# Patient Record
Sex: Female | Born: 1995
Health system: Southern US, Community
[De-identification: ages and names within clinical notes are randomized; demographics above are authoritative.]

## PROBLEM LIST (undated history)

## (undated) DIAGNOSIS — T7840XA Allergy, unspecified, initial encounter: Secondary | ICD-10-CM

## (undated) DIAGNOSIS — F419 Anxiety disorder, unspecified: Secondary | ICD-10-CM

## (undated) HISTORY — DX: Allergy, unspecified, initial encounter: T78.40XA

## (undated) HISTORY — DX: Anxiety disorder, unspecified: F41.9

---
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
## 2016-12-30 DIAGNOSIS — L700 Acne vulgaris: Secondary | ICD-10-CM | POA: Diagnosis not present

## 2017-02-15 ENCOUNTER — Ambulatory Visit (INDEPENDENT_AMBULATORY_CARE_PROVIDER_SITE_OTHER): Payer: BLUE CROSS/BLUE SHIELD | Admitting: Family Medicine

## 2017-02-15 ENCOUNTER — Encounter: Payer: Self-pay | Admitting: Family Medicine

## 2017-02-15 VITALS — BP 108/70 | HR 86 | Temp 98.0°F | Ht 61.5 in | Wt 126.5 lb

## 2017-02-15 DIAGNOSIS — L700 Acne vulgaris: Secondary | ICD-10-CM | POA: Diagnosis not present

## 2017-02-15 DIAGNOSIS — Z113 Encounter for screening for infections with a predominantly sexual mode of transmission: Secondary | ICD-10-CM | POA: Insufficient documentation

## 2017-02-15 DIAGNOSIS — Z0000 Encounter for general adult medical examination without abnormal findings: Secondary | ICD-10-CM | POA: Diagnosis not present

## 2017-02-15 DIAGNOSIS — Z23 Encounter for immunization: Secondary | ICD-10-CM | POA: Diagnosis not present

## 2017-02-15 MED ORDER — NORGESTIMATE-ETH ESTRADIOL 0.25-35 MG-MCG PO TABS: 1.0000 | ORAL_TABLET | Freq: Every day | ORAL | 11 refills | Status: DC

## 2017-02-15 NOTE — Assessment & Plan Note (Signed)
Reviewed health habits including diet and exercise and skin cancer prevention Reviewed appropriate screening tests for age  Also reviewed health mt list, fam hx and immunization status , as well as social and family history   See HPI STD screening today at pt req for gc/chlamydia Started OC  Meningococcal vaccine given  Disc HPV vaccine- enc her to return for that or get it at school Plan to start pap tests at age 21 Disc expectations for health and college

## 2017-02-15 NOTE — Assessment & Plan Note (Signed)
gc and chlamydia tests today No symptoms  Uses condoms /disc std prev She declines hiv/ rpr tests today

## 2017-02-15 NOTE — Assessment & Plan Note (Addendum)
Under tx of derm- on retin A and doxycycline Will start OC today  Per pt- may be planning to start accutane in the future   Long discussion re: way to take OC properly and avoidance of smoking  Risks of blood clots outlined as well as possible side eff Pt aware that this does not prevent stds and condoms should still be used inst that it may take up to 3 months for menses to fall into rhythm or side eff to stop  Adv to call if problems or questions

## 2017-02-15 NOTE — Patient Instructions (Addendum)
2nd meningococcal vaccine today   I want you to read about and consider the HPV vaccine   If you travel out of the country -we would consider the Hepatitis A vaccine   Start the oral contraceptive the first Sunday after your period starts  I sent in the generic for ortho cyclen  Stick with mornings or evenings (set alarm on your cell phone) It will take generally 3 months for menses to become predictable and for acne to improve  You must use condoms for STD prevention ! If any intolerable side effects or menstrual irregularity-let me  Don't smoke (very dangerous on the pill)   Follow up with your dermatologist as planned

## 2017-02-15 NOTE — Progress Notes (Signed)
Subjective:    Patient ID: Danielle Perry, female    DOB: 07/17/1996, 21 y.o.   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   bmi 23.5  Imms: Had meningococcal 2/15  Tdap 5/10   Needs 2nd meningococcal vaccine   Periods are regular  Not too heavy or painful -just the first day  Last 5 days  Has been sexually active  Using condoms for birth control   She is interested in starting OC   Has not had the HPV vaccine   Is interested in gc/chlamydia screening  No symptoms  Declines hiv or rpr or other tests    She takes doxycycline and retin A for acne   Patient Active Problem List   Diagnosis Date Noted  . Routine general medical examination at a health care facility 02/15/2017  . Screen for STD (sexually transmitted disease) 02/15/2017  . Dysmenorrhea 10/19/2013  . Acne 10/19/2013  . Well adolescent visit 10/13/2012   No past medical history on file. No past surgical history on file. Social History  Substance Use Topics  . Smoking status: Never Smoker  . Smokeless tobacco: Never Used  . Alcohol use No   No family history on file. Allergies  Allergen Reactions  . Penicillins    No current outpatient prescriptions on file prior to visit.   No current facility-administered medications on file prior to visit.       Review of Systems Review of Systems  Constitutional: Negative for fever, appetite change, fatigue and unexpected  weight change.  Eyes: Negative for pain and visual disturbance.  Respiratory: Negative for cough and shortness of breath.   Cardiovascular: Negative for cp or palpitations    Gastrointestinal: Negative for nausea, diarrhea and constipation.  Genitourinary: Negative for urgency and frequency. neg for vaginal d/c or pelvic pain Skin: Negative for pallor or rash  pos for facial acne/ some on back -difficult to control Neurological: Negative for weakness, light-headedness, numbness and headaches.  Hematological: Negative for adenopathy. Does not bruise/bleed easily.  Psychiatric/Behavioral: Negative for dysphoric mood. The patient is not nervous/anxious.         Objective:   Physical Exam  Constitutional: She appears well-developed and well-nourished. No distress.  Well appearing   HENT:  Head: Normocephalic and atraumatic.  Right Ear: External ear normal.  Left Ear: External ear normal.  Nose: Nose normal.  Mouth/Throat: Oropharynx is clear and moist.  Eyes: Conjunctivae and EOM are normal. Pupils are equal, round, and reactive to light. Right eye exhibits no discharge. Left eye exhibits no discharge. No scleral icterus.  Neck: Normal range of motion. Neck supple. No JVD present. Carotid bruit is not present. No thyromegaly present.  Cardiovascular: Normal rate, regular rhythm, normal heart sounds and intact distal pulses.  Exam reveals no gallop.   Pulmonary/Chest: Effort normal and breath  sounds normal. No respiratory distress. She has no wheezes. She has no rales.  Abdominal: Soft. Bowel sounds are normal. She exhibits no distension and no mass. There is no tenderness.  Musculoskeletal: She exhibits no edema or tenderness.  Lymphadenopathy:    She has no cervical adenopathy.  Neurological: She is alert. She has normal reflexes. No cranial nerve deficit. She exhibits normal muscle tone. Coordination normal.  Skin: Skin is warm and dry. No rash noted. No erythema. No pallor.    Significant inflammatory acne vulgaris on face and upper back   Psychiatric: She has a normal mood and affect.  Pleasant and talkative           Assessment & Plan:   Problem List Items Addressed This Visit      Musculoskeletal and Integument   Acne    Under tx of derm- on retin A and doxycycline Will start OC today  Per pt- may be planning to start accutane in the future   Long discussion re: way to take OC properly and avoidance of smoking  Risks of blood clots outlined as well as possible side eff Pt aware that this does not prevent stds and condoms should still be used inst that it may take up to 3 months for menses to fall into rhythm or side eff to stop  Adv to call if problems or questions         Relevant Medications   doxycycline (VIBRAMYCIN) 100 MG capsule   tretinoin (RETIN-A) 0.025 % cream   norgestimate-ethinyl estradiol (ORTHO-CYCLEN,SPRINTEC,PREVIFEM) 0.25-35 MG-MCG tablet     Other   Routine general medical examination at a health care facility - Primary    Reviewed health habits including diet and exercise and skin cancer prevention Reviewed appropriate screening tests for age  Also reviewed health mt list, fam hx and immunization status , as well as social and family history   See HPI STD screening today at pt req for gc/chlamydia Started OC  Meningococcal vaccine given  Disc HPV vaccine- enc her to return for that or get it at school Plan to start pap tests at age 29 Disc expectations for health and college        Screen for STD (sexually transmitted disease)    gc and chlamydia tests today No symptoms  Uses condoms /disc std prev She declines hiv/ rpr tests today      Relevant Orders   GC/Chlamydia Probe Amp    Other Visit Diagnoses    Need for meningitis vaccination       Relevant Orders   Meningococcal conjugate vaccine 4-valent IM (Completed)

## 2017-02-16 LAB — GC/CHLAMYDIA PROBE AMP
CT PROBE, AMP APTIMA: NOT DETECTED
GC Probe RNA: NOT DETECTED

## 2017-03-18 ENCOUNTER — Telehealth: Payer: Self-pay

## 2017-03-18 NOTE — Telephone Encounter (Signed)
Immunization fixed and pt notified copy is ready for pick up

## 2017-03-18 NOTE — Telephone Encounter (Signed)
I don't give Td to young folks as a rule so I'm sure it was a Tdap (unless chart states she is allergic to Tdap-which it dose not)  Can go ahead and document Tdap for last tetanus shot  Thanks

## 2017-03-18 NOTE — Telephone Encounter (Signed)
I looked in [facility] and she has no immunizations, I'm not sure if pt received Td or Tdap I can't tell either and it looks like this vaccine xfered from the old computer system, I checked with Lyla SonCarrie and no one has access to the old computer sys to check, please advise

## 2017-03-18 NOTE — Telephone Encounter (Signed)
Pt needs documentation of a Tdap; when print the immunization list it comes up Td but if you click on Td it has state Tdap. How to get the Tdap to print? Pt request cb.

## 2017-03-31 DIAGNOSIS — L700 Acne vulgaris: Secondary | ICD-10-CM | POA: Diagnosis not present

## 2017-04-11 ENCOUNTER — Encounter: Payer: Self-pay | Admitting: Family Medicine

## 2017-08-19 DIAGNOSIS — L700 Acne vulgaris: Secondary | ICD-10-CM | POA: Diagnosis not present

## 2017-10-21 ENCOUNTER — Ambulatory Visit: Payer: BLUE CROSS/BLUE SHIELD | Admitting: Family Medicine

## 2017-10-21 ENCOUNTER — Ambulatory Visit (INDEPENDENT_AMBULATORY_CARE_PROVIDER_SITE_OTHER): Payer: BLUE CROSS/BLUE SHIELD | Admitting: Family Medicine

## 2017-10-21 ENCOUNTER — Ambulatory Visit (INDEPENDENT_AMBULATORY_CARE_PROVIDER_SITE_OTHER)
Admission: RE | Admit: 2017-10-21 | Discharge: 2017-10-21 | Disposition: A | Discharge status: Home / Self Care | Payer: BLUE CROSS/BLUE SHIELD | Source: Ambulatory Visit | Attending: Family Medicine | Admitting: Family Medicine

## 2017-10-21 ENCOUNTER — Encounter: Payer: Self-pay | Admitting: Family Medicine

## 2017-10-21 VITALS — BP 106/70 | HR 95 | Temp 98.5°F | Ht 61.5 in | Wt 131.1 lb

## 2017-10-21 DIAGNOSIS — M25561 Pain in right knee: Secondary | ICD-10-CM

## 2017-10-21 MED ORDER — MELOXICAM 15 MG PO TABS: 15.0000 mg | ORAL_TABLET | Freq: Every day | ORAL | 1 refills | Status: DC

## 2017-10-21 NOTE — Patient Instructions (Signed)
I think you have knee pain - that may be related to strain/patellar tracking problem  Let's check an xray today  Start meloxicam 15 mg once daily with food for 2-4 weeks  Ice - at least twice daily for 10 minutes  Avoid exercise that really hurts (ie: squats)   Get a neoprene knee sleeve with patellar hole -at the drug store   We will make a plan from there

## 2017-10-21 NOTE — Progress Notes (Signed)
Subjective:    Patient ID: Danielle Perry, female    DOB: 11/05/1995, 22 y.o.   MRN: [id number]  HPI Here today for R knee pain   Started around mid December No injury or trauma  ? If she could have slipped on the ice - but did not fall  Not swollen but feels tight  Hurts to bend it and to straighten all the way  Hurts on both sides of the patella - occ the whole knee More sharp than dull pain  Wakes up with pain sometimes and it is more severe (esp if she sleeps on her L side)   Hurts more to walk up a hill Stairs bother her - both up and down  Hurts after inactivity (gets stiff)   Pops at times  Never had knee problem before   Exercise - does planks, lunges/ and treadmill  No sports currently   Has taken ibuprofen and aleve-not helpful  No ice or heat  No knee brace  Wt Readings from Last 3 Encounters:  10/21/17 131 lb 1.9 oz (59.5 kg)  02/15/17 126 lb 8 oz (57.4 kg)  10/19/13 140 lb 12 oz (63.8 kg) (78 %, Z= 0.78)*   * Growth percentiles are based on CDC (Girls, 2-20 Years) data.   24.37 kg/m   Patient Active Problem List   Diagnosis Date Noted  . Right anterior knee pain 10/21/2017  . Routine general medical examination at a health care facility 02/15/2017  . Screen for STD (sexually transmitted disease) 02/15/2017  . Dysmenorrhea 10/19/2013  . Acne 10/19/2013  . Well adolescent visit 10/13/2012   History reviewed. No pertinent past medical history. History reviewed. No pertinent surgical history. Social History   Tobacco Use  . Smoking status: Never Smoker  . Smokeless tobacco: Never Used  Substance Use Topics  . Alcohol use: No  . Drug use: No   History reviewed. No pertinent family history. Allergies  Allergen Reactions  . Penicillins    Current Outpatient Medications on File Prior to Visit  Medication Sig Dispense Refill  . norgestimate-ethinyl estradiol (ORTHO-CYCLEN,SPRINTEC,PREVIFEM) 0.25-35 MG-MCG tablet Take 1 tablet by mouth daily. 1  Package 11  . tretinoin (RETIN-A) 0.025 % cream Apply 1 application topically at bedtime.  5   No current facility-administered medications on file prior to visit.      Review of Systems  Constitutional: Negative for activity change, appetite change, fatigue, fever and unexpected weight change.  HENT: Negative for congestion, ear pain, rhinorrhea, sinus pressure and sore throat.   Eyes: Negative for pain, redness and visual disturbance.  Respiratory: Negative for cough, shortness of breath and wheezing.   Cardiovascular: Negative for chest pain and palpitations.  Gastrointestinal: Negative for abdominal pain, blood in stool, constipation and diarrhea.  Endocrine: Negative for polydipsia and polyuria.  Genitourinary: Negative for dysuria, frequency and urgency.  Musculoskeletal: Positive for arthralgias. Negative for back pain and myalgias.       Right knee pain   Skin: Negative for pallor and rash.  Allergic/Immunologic: Negative for environmental allergies.  Neurological: Negative for dizziness, syncope and headaches.  Hematological: Negative for adenopathy. Does not bruise/bleed easily.  Psychiatric/Behavioral: Negative for decreased concentration and dysphoric mood. The patient is not nervous/anxious.        Objective:   Physical Exam  Constitutional: She appears well-developed and well-nourished. No distress.  Well appearing   HENT:  Head: Normocephalic and atraumatic.  Eyes: Conjunctivae and EOM are normal. Pupils are equal, round, and  reactive to light. Right eye exhibits no discharge. Left eye exhibits no discharge. No scleral icterus.  Neck: Normal range of motion. Neck supple.  Cardiovascular: Normal rate, regular rhythm and normal heart sounds.  Pulmonary/Chest: Effort normal and breath sounds normal.  Musculoskeletal: She exhibits tenderness. She exhibits no edema.       Right knee: She exhibits normal range of motion, no swelling, no effusion, no ecchymosis, no  deformity, no erythema, normal alignment, no LCL laxity, normal patellar mobility, no bony tenderness and normal meniscus. Tenderness found. Medial joint line, lateral joint line and patellar tendon tenderness noted.  Pain on full flexion of R knee  Apprehensive to fully extend  Pos bounce test  Neg mc murray Tender -bilat joint lines and patellar tendon  Pos patellar grind   No eff or edema   Lymphadenopathy:    She has no cervical adenopathy.  Neurological: She is alert. She has normal strength and normal reflexes. She displays no atrophy. No sensory deficit. She exhibits normal muscle tone.  Skin: Skin is warm and dry. No rash noted. No erythema. No pallor.  Psychiatric: She has a normal mood and affect.          Assessment & Plan:   Problem List Items Addressed This Visit      Other   Right anterior knee pain - Primary    Suspect patellofemoral etiology/strain  Handout given  Disc use of ice/ neoprene knee sleeve/elevation  Stretching as tolerated meloxicam 15 mg with food daily prn  Xray today        Relevant Orders   DG Knee 4 Views W/Patella Right (Completed)

## 2017-10-22 NOTE — Assessment & Plan Note (Signed)
Suspect patellofemoral etiology/strain  Handout given  Disc use of ice/ neoprene knee sleeve/elevation  Stretching as tolerated meloxicam 15 mg with food daily prn  Xray today

## 2017-10-24 ENCOUNTER — Telehealth: Payer: Self-pay | Admitting: Family Medicine

## 2017-10-24 DIAGNOSIS — M25561 Pain in right knee: Secondary | ICD-10-CM

## 2017-10-24 NOTE — Telephone Encounter (Signed)
-----   Message from Shon MilletShapale M [place], [place] sent at 10/24/2017 12:39 PM EST ----- Pt notified of xray results and Dr. Royden Purlower's comments. Pt agrees with referral if possible [place], please put referral in and I [facility]

## 2017-10-24 NOTE — Telephone Encounter (Signed)
Thanks  [facility]

## 2017-10-28 DIAGNOSIS — S86811A Strain of other muscle(s) and tendon(s) at lower leg level, right leg, initial encounter: Secondary | ICD-10-CM | POA: Diagnosis not present

## 2018-01-13 DIAGNOSIS — S86811A Strain of other muscle(s) and tendon(s) at lower leg level, right leg, initial encounter: Secondary | ICD-10-CM | POA: Diagnosis not present

## 2018-01-13 DIAGNOSIS — M25561 Pain in right knee: Secondary | ICD-10-CM | POA: Diagnosis not present

## 2018-01-30 DIAGNOSIS — M25561 Pain in right knee: Secondary | ICD-10-CM | POA: Diagnosis not present

## 2018-01-31 ENCOUNTER — Telehealth: Payer: Self-pay | Admitting: Family Medicine

## 2018-01-31 ENCOUNTER — Encounter: Payer: Self-pay | Admitting: Family Medicine

## 2018-01-31 MED ORDER — NORGESTIMATE-ETH ESTRADIOL 0.25-35 MG-MCG PO TABS: 1.0000 | ORAL_TABLET | Freq: Every day | ORAL | 11 refills | Status: DC

## 2018-01-31 NOTE — Telephone Encounter (Signed)
LOV 10/21/17 LOV medication addressed 02/15/17 PCP Dr. Milinda Antis

## 2018-01-31 NOTE — Telephone Encounter (Signed)
Copied from CRM [id number]. Topic: Quick Communication - Rx Refill/Question >> Jan 31, 2018 11:48 AM Oneal Grout wrote: Medication: norgestimate-ethinyl estradiol (ORTHO-CYCLEN,SPRINTEC,PREVIFEM) 0.25-35 MG-MCG tablet  Has the patient contacted their pharmacy? Yes.  Told to contact office, refill was over a year ago (Agent: If no, request that the patient contact the pharmacy for the refill.) (Agent: If yes, when and what did the pharmacy advise?)  Preferred Pharmacy (with phone number or street name): [facility] [address]  [place]  Agent: Please be advised that RX refills may take up to 3 business days. We ask that you follow-up with your pharmacy.

## 2018-03-04 IMAGING — DX DG KNEE COMPLETE 4+V*R*
4 series · 4 of 4 positions shown · non-contrast
Comparison: None.

CLINICAL DATA: 22-year-old female with 2 months of right knee pain.
No known injury.

EXAM:
RIGHT KNEE - COMPLETE 4+ VIEW

[knee ap]
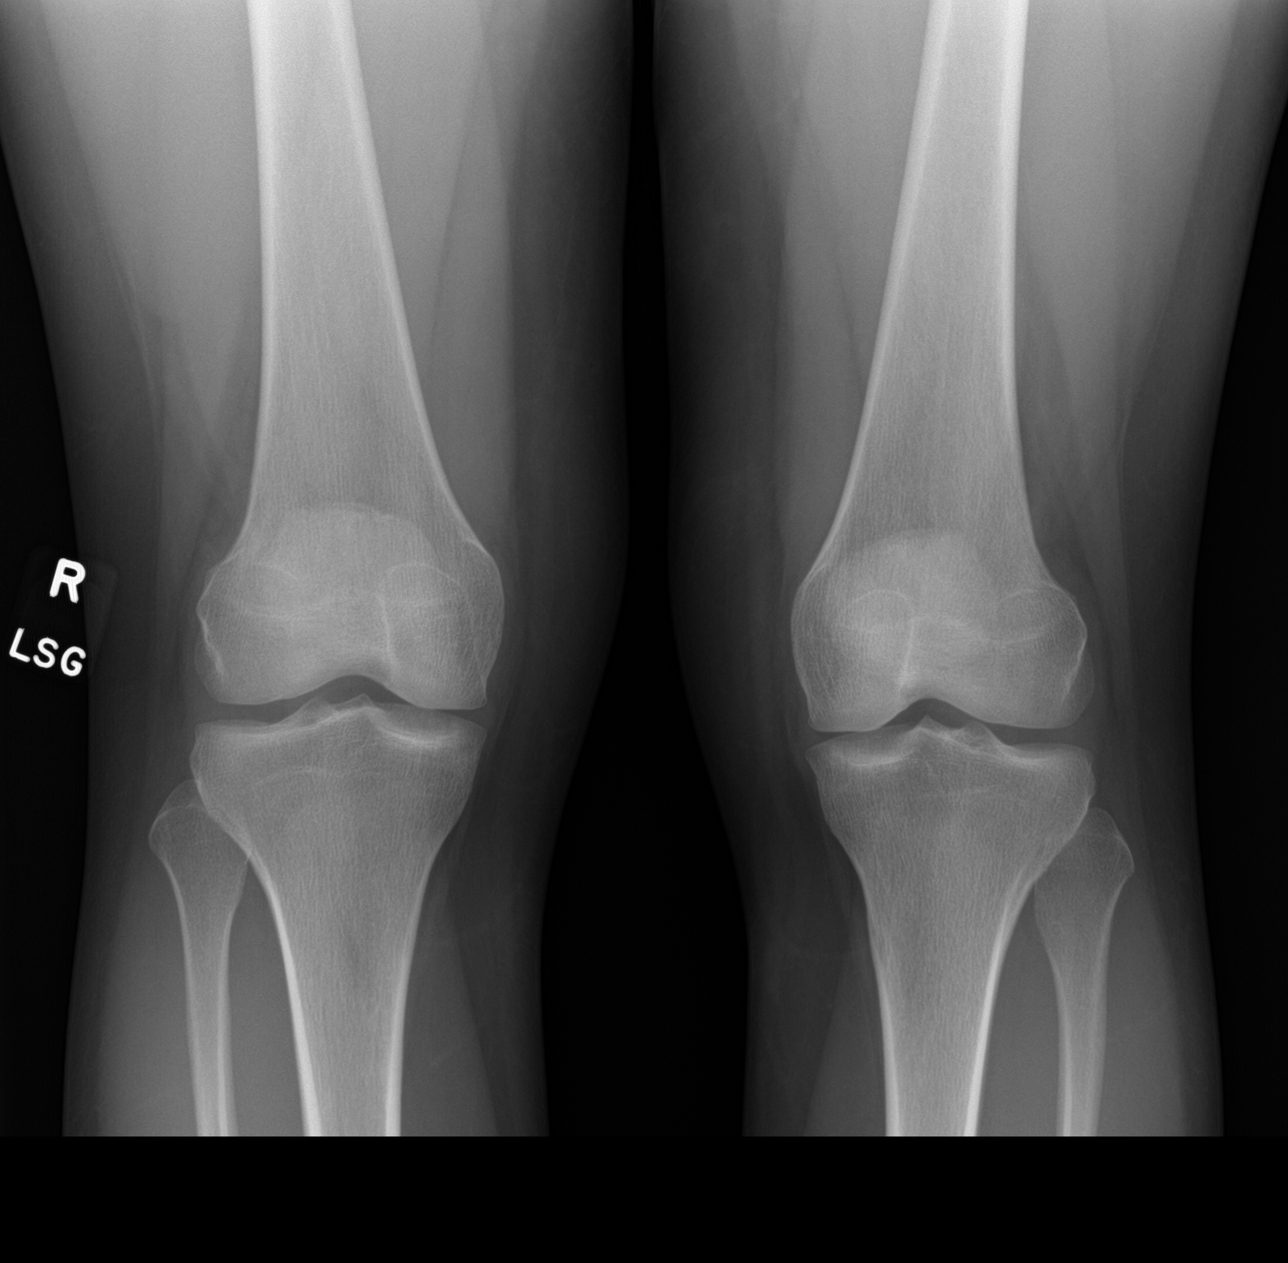

[knee tunnel]
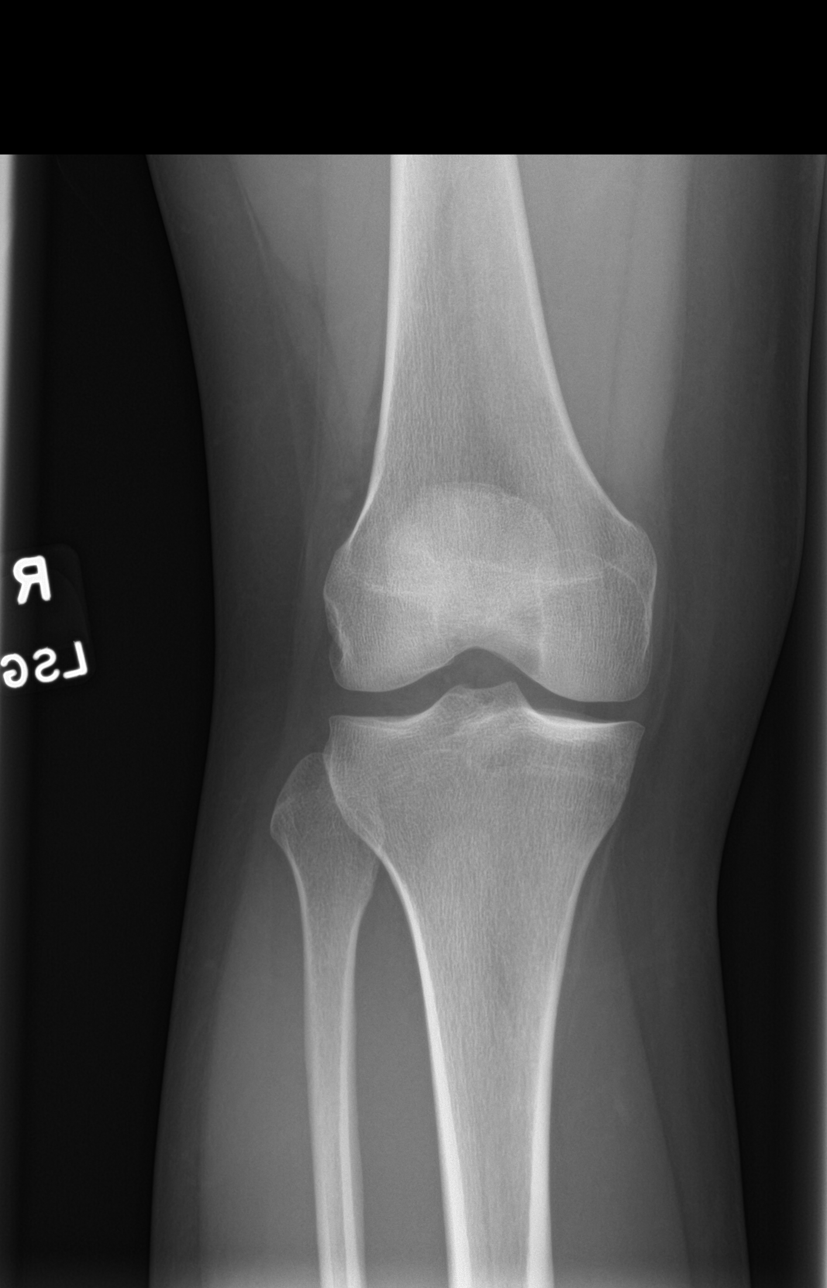

[knee lat]
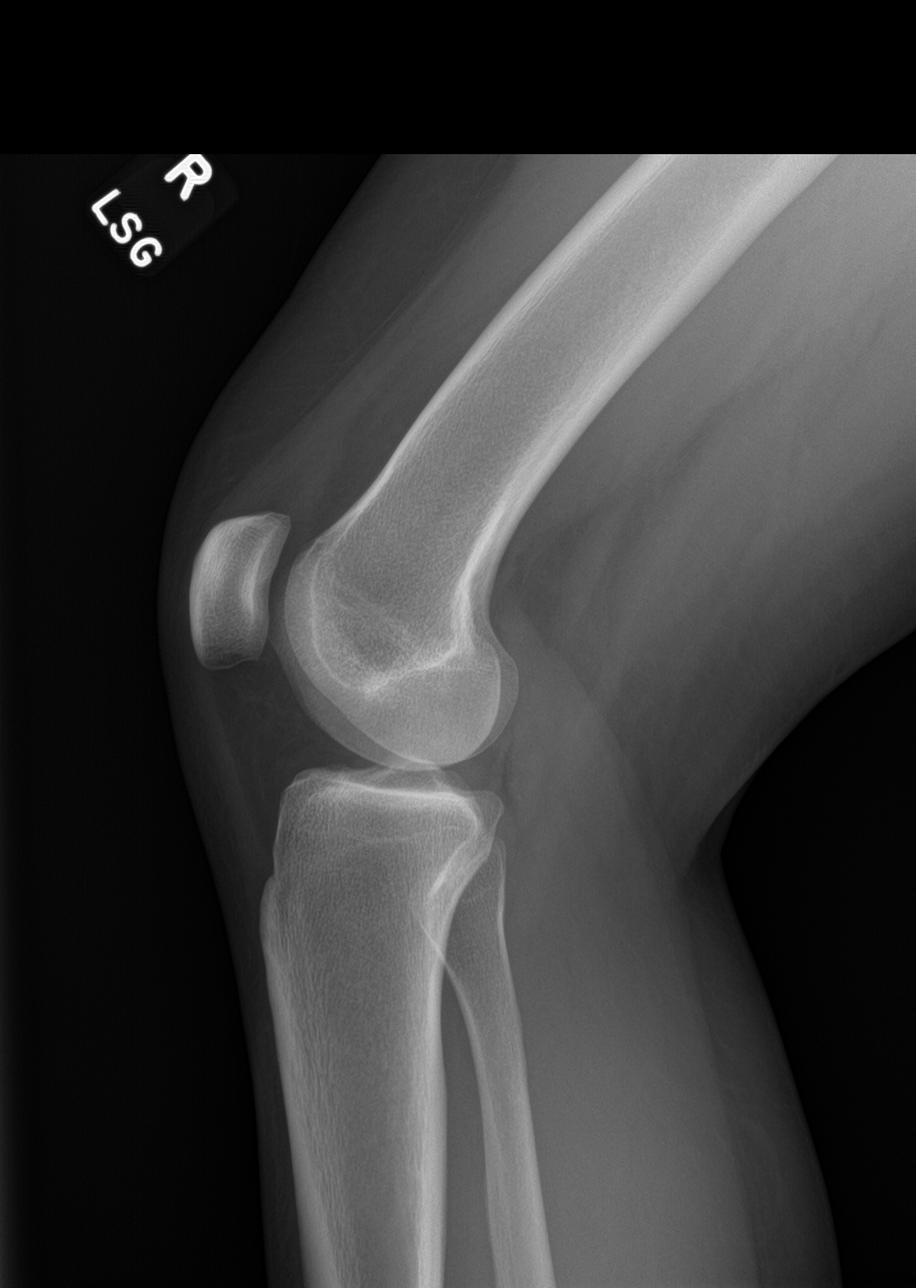

[patella skyline]
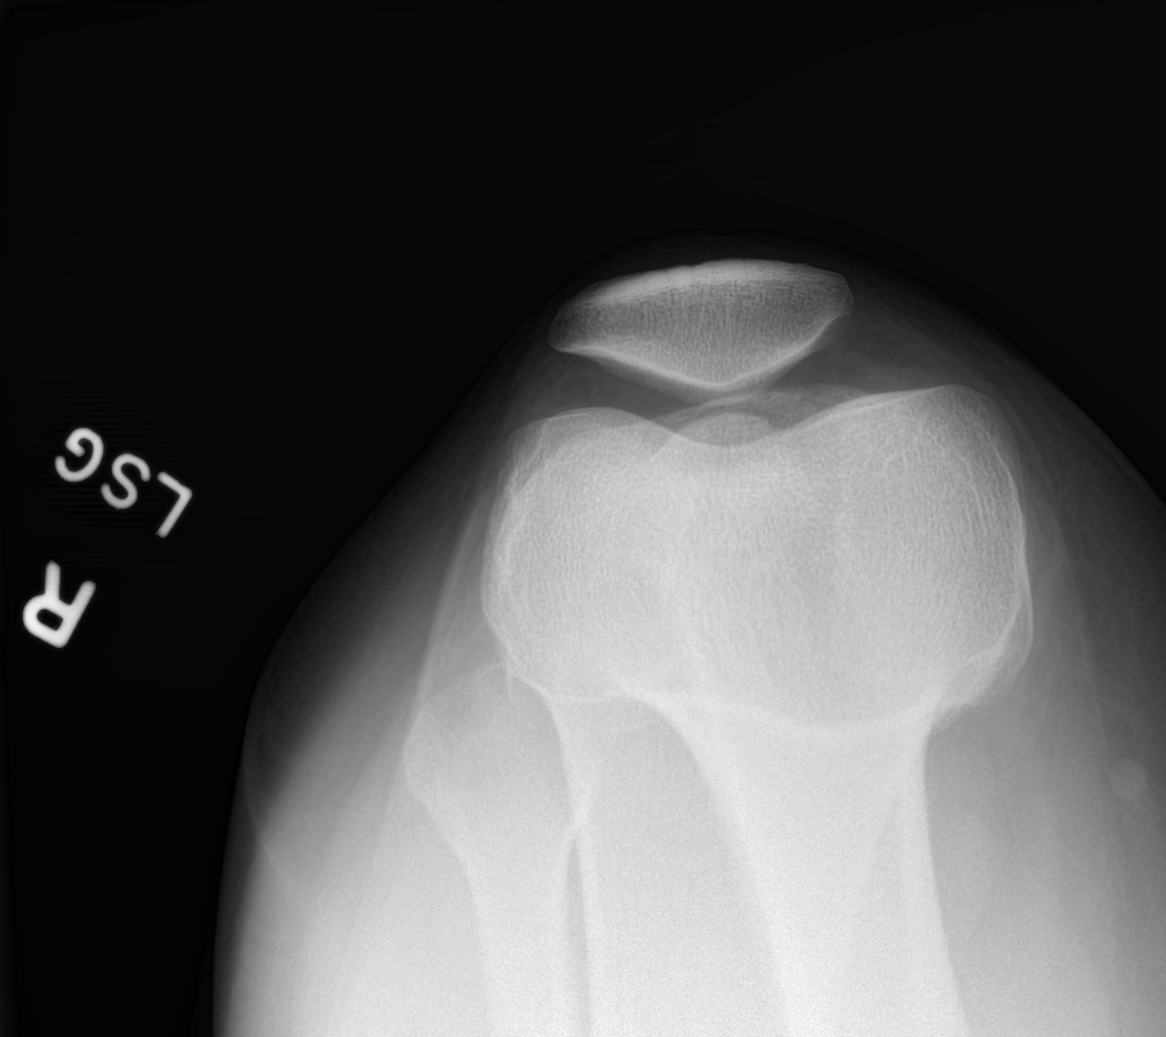

[4 of 4 positions shown; findings below may reference images not displayed]

FINDINGS: Weightbearing views. Bone mineralization is within normal limits.
Borderline to mild medial compartment joint space loss at the right
knee. Possible small joint effusion on the lateral view. Normal
patella, lateral and patellofemoral joint spaces. No osseous
abnormality identified.
IMPRESSION: Mild medial compartment joint space loss and possible small right
knee joint effusion.

## 2018-08-18 DIAGNOSIS — L700 Acne vulgaris: Secondary | ICD-10-CM | POA: Diagnosis not present

## 2018-10-22 ENCOUNTER — Telehealth: Payer: Self-pay | Admitting: Family Medicine

## 2018-10-24 NOTE — Telephone Encounter (Signed)
Med refilled once and Carrie will reach out to pt to get appt scheduled  

## 2018-10-24 NOTE — Telephone Encounter (Signed)
Pt hasn't been seen in over a year and no future appts., please advise  

## 2018-10-24 NOTE — Telephone Encounter (Signed)
Please schedule f/u and refill until then  

## 2018-10-24 NOTE — Telephone Encounter (Signed)
I left a message on patient's voice mail to call back and scheduled cpx.

## 2018-10-25 NOTE — Telephone Encounter (Signed)
Cpx 3/10 Pt aware

## 2018-11-13 DIAGNOSIS — L700 Acne vulgaris: Secondary | ICD-10-CM | POA: Diagnosis not present

## 2018-11-14 ENCOUNTER — Encounter: Payer: BLUE CROSS/BLUE SHIELD | Admitting: Family Medicine

## 2018-11-15 ENCOUNTER — Ambulatory Visit (INDEPENDENT_AMBULATORY_CARE_PROVIDER_SITE_OTHER): Payer: BLUE CROSS/BLUE SHIELD | Admitting: Family Medicine

## 2018-11-15 ENCOUNTER — Encounter: Payer: Self-pay | Admitting: Family Medicine

## 2018-11-15 ENCOUNTER — Other Ambulatory Visit (HOSPITAL_COMMUNITY)
Admission: RE | Admit: 2018-11-15 | Discharge: 2018-11-15 | Disposition: A | Discharge status: Home / Self Care | Payer: BLUE CROSS/BLUE SHIELD | Source: Ambulatory Visit | Attending: Family Medicine | Admitting: Family Medicine

## 2018-11-15 ENCOUNTER — Other Ambulatory Visit: Payer: Self-pay

## 2018-11-15 VITALS — BP 122/78 | HR 82 | Temp 98.2°F | Ht 61.25 in | Wt 139.2 lb

## 2018-11-15 DIAGNOSIS — Z0000 Encounter for general adult medical examination without abnormal findings: Secondary | ICD-10-CM | POA: Diagnosis not present

## 2018-11-15 DIAGNOSIS — N946 Dysmenorrhea, unspecified: Secondary | ICD-10-CM

## 2018-11-15 DIAGNOSIS — Z01419 Encounter for gynecological examination (general) (routine) without abnormal findings: Secondary | ICD-10-CM | POA: Diagnosis not present

## 2018-11-15 DIAGNOSIS — L700 Acne vulgaris: Secondary | ICD-10-CM

## 2018-11-15 DIAGNOSIS — Z113 Encounter for screening for infections with a predominantly sexual mode of transmission: Secondary | ICD-10-CM

## 2018-11-15 DIAGNOSIS — Z23 Encounter for immunization: Secondary | ICD-10-CM | POA: Diagnosis not present

## 2018-11-15 MED ORDER — NORGESTIMATE-ETH ESTRADIOL 0.25-35 MG-MCG PO TABS: 1.0000 | ORAL_TABLET | Freq: Every day | ORAL | 3 refills | Status: DC

## 2018-11-15 NOTE — Patient Instructions (Signed)
Pap and gyn exam done today   Also gonorrhea and chlamydia test (with the pap)   HPV vaccine number one today also   Try to eat a healthy diet  Also regular exercise when you can fit it in

## 2018-11-15 NOTE — Assessment & Plan Note (Signed)
First gyn exam and pap today  Pt continues current OC -no problems Monogamous and uses condoms First HPV vaccine today  Gc/chlamydia screen  Declines other std testing No abnormalities or symptoms

## 2018-11-15 NOTE — Assessment & Plan Note (Signed)
Reviewed health habits including diet and exercise and skin cancer prevention Reviewed appropriate screening tests for age  Also reviewed health mt list, fam hx and immunization status , as well as social and family history   See HPI Declines labs Pap/gyn exam and screen for gc/chlam today  First HPV vaccine today  Declines flu vaccines  Disc self care Disc PHQ score/elevated- this is from anxiety and sleep issues finishing last semester at school- counseled about good sleep hygiene and self care today  Diet/exercise reviewed

## 2018-11-15 NOTE — Assessment & Plan Note (Signed)
Seeing dermatology  On a new topical tx (she does not remember the name of)

## 2018-11-15 NOTE — Progress Notes (Signed)
Subjective:    Patient ID: Danielle Perry, female    DOB: [birth year], 23 y.o.   MRN: [id number]  HPI Here for health maintenance exam and to review chronic medical problems    Wt Readings from Last 3 Encounters:  11/15/18 139 lb 3 oz (63.1 kg)  10/21/17 131 lb 1.9 oz (59.5 kg)  02/15/17 126 lb 8 oz (57.4 kg)   26.08 kg/m   In her last semester in college Will graduating soon   Stressed and tired  Taking fair care of herself - is eating 3 meals per day  No time for exercise    Desires gc/chlamidia screen but not HIV or RPR or other testing  Declines any blood owrk  Pap  Takes OC Sprintec  Going well  Periods are not crampy as a rule  Not very heavy  Lasting 5 days  Wants to stay on it   Is in a monogamous relationship Using condoms   Flu shot - declines   Tetanus shot 5/10   HPV vaccine  Forgot to get  Will get first one today   Diabetes in family  No breast cancer in family    Sees dermatology for acne-starting new topical/ she cannot remember the name  Patient Active Problem List   Diagnosis Date Noted  . Encounter for gynecological examination (general) (routine) without abnormal findings 11/15/2018  . Right anterior knee pain 10/21/2017  . Routine general medical examination at a health care facility 02/15/2017  . Screen for STD (sexually transmitted disease) 02/15/2017  . Dysmenorrhea 10/19/2013  . Acne 10/19/2013  . Well adolescent visit 10/13/2012   History reviewed. No pertinent past medical history. History reviewed. No pertinent surgical history. Social History   Tobacco Use  . Smoking status: Never Smoker  . Smokeless tobacco: Never Used  Substance Use Topics  . Alcohol use: Yes    Comment: occ  . Drug use: No   History reviewed. No pertinent family history. Allergies  Allergen Reactions  . Penicillins    No current outpatient medications on file prior to visit.   No current facility-administered medications on file prior to  visit.     Review of Systems  Constitutional: Negative for activity change, appetite change, fatigue, fever and unexpected weight change.  HENT: Negative for congestion, ear pain, rhinorrhea, sinus pressure and sore throat.   Eyes: Negative for pain, redness and visual disturbance.  Respiratory: Negative for cough, shortness of breath and wheezing.   Cardiovascular: Negative for chest pain and palpitations.  Gastrointestinal: Negative for abdominal pain, blood in stool, constipation and diarrhea.  Endocrine: Negative for polydipsia and polyuria.  Genitourinary: Negative for dysuria, frequency and urgency.  Musculoskeletal: Negative for arthralgias, back pain and myalgias.  Skin: Negative for pallor and rash.       acne  Allergic/Immunologic: Negative for environmental allergies.  Neurological: Negative for dizziness, syncope and headaches.  Hematological: Negative for adenopathy. Does not bruise/bleed easily.  Psychiatric/Behavioral: Negative for decreased concentration and dysphoric mood. The patient is not nervous/anxious.        Stressors        Objective:   Physical Exam Constitutional:      General: She is not in acute distress.    Appearance: Normal appearance. She is well-developed and normal weight. She is not ill-appearing.  HENT:     Head: Normocephalic and atraumatic.     Right Ear: Tympanic membrane, ear canal and external ear normal.     Left Ear: Tympanic  membrane, ear canal and external ear normal.     Nose: Nose normal.     Mouth/Throat:     Mouth: Mucous membranes are moist.     Pharynx: Oropharynx is clear.  Eyes:     General: No scleral icterus.    Conjunctiva/sclera: Conjunctivae normal.     Pupils: Pupils are equal, round, and reactive to light.  Neck:     Musculoskeletal: Normal range of motion and neck supple.     Thyroid: No thyromegaly.     Vascular: No carotid bruit or JVD.  Cardiovascular:     Rate and Rhythm: Normal rate and regular rhythm.      Pulses: Normal pulses.     Heart sounds: Normal heart sounds. No gallop.   Pulmonary:     Effort: Pulmonary effort is normal. No respiratory distress.     Breath sounds: Normal breath sounds. No wheezing.  Chest:     Chest wall: No tenderness.  Abdominal:     General: Bowel sounds are normal. There is no distension or abdominal bruit.     Palpations: Abdomen is soft. There is no mass.     Tenderness: There is no abdominal tenderness.  Genitourinary:    Comments: Breast exam: No mass, nodules, thickening, tenderness, bulging, retraction, inflamation, nipple discharge or skin changes noted.  No axillary or clavicular LA.             Anus appears normal w/o hemorrhoids or masses     External genitalia : nl appearance and hair distribution/no lesions     Urethral meatus : nl size, no lesions or prolapse     Urethra: no masses, tenderness or scarring    Bladder : no masses or tenderness     Vagina: nl general appearance, no discharge or  Lesions, no significant cystocele  or rectocele     Cervix: no lesions/ discharge or friability    Uterus: nl size, contour, position, and mobility (not fixed) , non tender    Adnexa : no masses, tenderness, enlargement or nodularity       Musculoskeletal: Normal range of motion.        General: No tenderness.     Right lower leg: No edema.     Left lower leg: No edema.  Lymphadenopathy:     Cervical: No cervical adenopathy.  Skin:    General: Skin is warm and dry.     Coloration: Skin is not pale.     Findings: No erythema or rash.     Comments: Mod to severe facial acne vulgaris with some cysts   Lentigines on trunk  Neurological:     General: No focal deficit present.     Mental Status: She is alert.     Cranial Nerves: No cranial nerve deficit.     Motor: No abnormal muscle tone.     Coordination: Coordination normal.     Deep Tendon Reflexes: Reflexes are normal and symmetric. Reflexes normal.  Psychiatric:         Mood and Affect: Mood normal. Mood is not anxious or depressed. Affect is not tearful.        Speech: Speech normal.        Behavior: Behavior normal.        Thought Content: Thought content normal.        Cognition and Memory: Cognition normal.     Comments: Pleasant  Cheerful Nl affect Candid when discussing stressors of finishing school  Assessment & Plan:   Problem List Items Addressed This Visit      Musculoskeletal and Integument   Acne    Seeing dermatology  On a new topical tx (she does not remember the name of)      Relevant Medications   norgestimate-ethinyl estradiol (SPRINTEC 28) 0.25-35 MG-MCG tablet     Genitourinary   Dysmenorrhea    Resolved with OC  Exam done today        Other   Routine general medical examination at a health care facility - Primary    Reviewed health habits including diet and exercise and skin cancer prevention Reviewed appropriate screening tests for age  Also reviewed health mt list, fam hx and immunization status , as well as social and family history   See HPI Declines labs Pap/gyn exam and screen for gc/chlam today  First HPV vaccine today  Declines flu vaccines  Disc self care Disc PHQ score/elevated- this is from anxiety and sleep issues finishing last semester at school- counseled about good sleep hygiene and self care today  Diet/exercise reviewed       Relevant Orders   HPV 9-valent vaccine,Recombinat (Completed)   Screen for STD (sexually transmitted disease)    Gc/chlamydia added to pap  Declines other labs incl HIV and RPR      Encounter for gynecological examination (general) (routine) without abnormal findings    First gyn exam and pap today  Pt continues current OC -no problems Monogamous and uses condoms First HPV vaccine today  Gc/chlamydia screen  Declines other std testing No abnormalities or symptoms      Relevant Orders   Cytology - PAP([facility])    Other Visit Diagnoses     Need for HPV vaccination       Relevant Orders   HPV 9-valent vaccine,Recombinat (Completed)

## 2018-11-15 NOTE — Assessment & Plan Note (Signed)
Resolved with OC  Exam done today

## 2018-11-15 NOTE — Assessment & Plan Note (Signed)
Gc/chlamydia added to pap  Declines other labs incl HIV and RPR

## 2018-11-17 LAB — CYTOLOGY - PAP
Chlamydia: NEGATIVE
DIAGNOSIS: NEGATIVE
Neisseria Gonorrhea: NEGATIVE

## 2019-01-17 ENCOUNTER — Ambulatory Visit (INDEPENDENT_AMBULATORY_CARE_PROVIDER_SITE_OTHER): Payer: BLUE CROSS/BLUE SHIELD

## 2019-01-17 DIAGNOSIS — Z23 Encounter for immunization: Secondary | ICD-10-CM | POA: Diagnosis not present

## 2019-01-17 NOTE — Progress Notes (Signed)
Per orders of Dr. Milinda Antis, injection of HPV given by Roena Malady.  Patient tolerated injection well.

## 2019-05-22 ENCOUNTER — Ambulatory Visit (INDEPENDENT_AMBULATORY_CARE_PROVIDER_SITE_OTHER): Payer: BC Managed Care – PPO | Admitting: *Deleted

## 2019-05-22 DIAGNOSIS — Z23 Encounter for immunization: Secondary | ICD-10-CM

## 2019-05-22 NOTE — Progress Notes (Signed)
Per orders of Dr. Glori Bickers, injection of HPV #3 given by Luka Reisch M in [place], IM. Patient tolerated injection well.

## 2019-06-29 ENCOUNTER — Other Ambulatory Visit: Payer: Self-pay

## 2019-06-29 DIAGNOSIS — Z20822 Contact with and (suspected) exposure to covid-19: Secondary | ICD-10-CM

## 2019-07-01 LAB — NOVEL CORONAVIRUS, NAA: SARS-CoV-2, NAA: NOT DETECTED

## 2019-11-29 ENCOUNTER — Other Ambulatory Visit: Payer: Self-pay | Admitting: Family Medicine

## 2019-11-29 NOTE — Telephone Encounter (Signed)
Spoke with pt about Dr. Royden Purl message. Pt understood and PE was scheduled. Rx refills was sent to pharmacy

## 2019-11-29 NOTE — Telephone Encounter (Signed)
Please advise. Pt was last seen 11/2018

## 2019-11-29 NOTE — Telephone Encounter (Signed)
Please schedule PE and refill until then  

## 2019-12-11 ENCOUNTER — Encounter: Payer: Self-pay | Admitting: Family Medicine

## 2019-12-11 ENCOUNTER — Ambulatory Visit (INDEPENDENT_AMBULATORY_CARE_PROVIDER_SITE_OTHER): Payer: BC Managed Care – PPO | Admitting: Family Medicine

## 2019-12-11 ENCOUNTER — Other Ambulatory Visit: Payer: Self-pay

## 2019-12-11 VITALS — BP 132/76 | HR 95 | Temp 98.3°F | Ht 61.75 in | Wt 152.4 lb

## 2019-12-11 DIAGNOSIS — Z0000 Encounter for general adult medical examination without abnormal findings: Secondary | ICD-10-CM | POA: Diagnosis not present

## 2019-12-11 DIAGNOSIS — Z23 Encounter for immunization: Secondary | ICD-10-CM | POA: Diagnosis not present

## 2019-12-11 DIAGNOSIS — L700 Acne vulgaris: Secondary | ICD-10-CM | POA: Diagnosis not present

## 2019-12-11 DIAGNOSIS — N946 Dysmenorrhea, unspecified: Secondary | ICD-10-CM | POA: Diagnosis not present

## 2019-12-11 LAB — COMPREHENSIVE METABOLIC PANEL
ALT: 11 U/L (ref 0–35)
AST: 12 U/L (ref 0–37)
Albumin: 4.2 g/dL (ref 3.5–5.2)
Alkaline Phosphatase: 56 U/L (ref 39–117)
BUN: 14 mg/dL (ref 6–23)
CO2: 26 mEq/L (ref 19–32)
Calcium: 9.4 mg/dL (ref 8.4–10.5)
Chloride: 104 mEq/L (ref 96–112)
Creatinine, Ser: 0.63 mg/dL (ref 0.40–1.20)
GFR: 116.84 mL/min (ref >60.00)
Glucose, Bld: 78 mg/dL (ref 70–99)
Potassium: 4.2 mEq/L (ref 3.5–5.1)
Sodium: 138 mEq/L (ref 135–145)
Total Bilirubin: 0.3 mg/dL (ref 0.2–1.2)
Total Protein: 6.6 g/dL (ref 6.0–8.3)

## 2019-12-11 LAB — CBC WITH DIFFERENTIAL/PLATELET
Basophils Absolute: 0 10*3/uL (ref 0.0–0.1)
Basophils Relative: 0.3 % (ref 0.0–3.0)
Eosinophils Absolute: 0 10*3/uL (ref 0.0–0.7)
Eosinophils Relative: 0.8 % (ref 0.0–5.0)
HCT: 40.1 % (ref 36.0–46.0)
Hemoglobin: 13.7 g/dL (ref 12.0–15.0)
Lymphocytes Relative: 23.4 % (ref 12.0–46.0)
Lymphs Abs: 1.5 10*3/uL (ref 0.7–4.0)
MCHC: 34.3 g/dL (ref 30.0–36.0)
MCV: 88.2 fl (ref 78.0–100.0)
Monocytes Absolute: 0.4 10*3/uL (ref 0.1–1.0)
Monocytes Relative: 6.9 % (ref 3.0–12.0)
Neutro Abs: 4.4 10*3/uL (ref 1.4–7.7)
Neutrophils Relative %: 68.6 % (ref 43.0–77.0)
Platelets: 285 10*3/uL (ref 150.0–400.0)
RBC: 4.55 Mil/uL (ref 3.87–5.11)
RDW: 12.3 % (ref 11.5–15.5)
WBC: 6.5 10*3/uL (ref 4.0–10.5)

## 2019-12-11 LAB — LIPID PANEL
Cholesterol: 154 mg/dL (ref 0–200)
HDL: 63.3 mg/dL (ref >39.00)
LDL Cholesterol: 71 mg/dL (ref 0–99)
NonHDL: 90.36
Total CHOL/HDL Ratio: 2
Triglycerides: 97 mg/dL (ref 0.0–149.0)
VLDL: 19.4 mg/dL (ref 0.0–40.0)

## 2019-12-11 LAB — TSH: TSH: 1.94 u[IU]/mL (ref 0.35–4.50)

## 2019-12-11 MED ORDER — NORGESTIMATE-ETH ESTRADIOL 0.25-35 MG-MCG PO TABS: 1.0000 | ORAL_TABLET | Freq: Every day | ORAL | 3 refills | Status: DC

## 2019-12-11 NOTE — Assessment & Plan Note (Signed)
Much improved with current OC/ sprintek Plans to continue it

## 2019-12-11 NOTE — Assessment & Plan Note (Signed)
Controlled primarily with OC  Has not seen dermatology in over a year Worse on chin from mask Using some tea tree products otc  Disc imp of sun protection

## 2019-12-11 NOTE — Assessment & Plan Note (Addendum)
Reviewed health habits including diet and exercise and skin cancer prevention Reviewed appropriate screening tests for age  Also reviewed health mt list, fam hx and immunization status , as well as social and family history   See HPI Labs for wellness today  Declines need for STD screen (in long term relationship and monogamous)  Tdap given today  utd pap a year ago  Plans to get a covid vaccine  Discussed importance of regular exercise -this may also help mood  Pt has some positive answers for PHQ- reviewed this /feels stress from pandemic but does not feel she needs treatment at this time  Urged her to let us know if she changes her mind

## 2019-12-11 NOTE — Progress Notes (Signed)
Subjective:    Patient ID: Danielle Perry, female    DOB: 04-14-1996, 24 y.o.   MRN: [id number]  This visit occurred during the SARS-CoV-2 public health emergency.  Safety protocols were in place, including screening questions prior to the visit, additional usage of staff PPE, and extensive cleaning of exam room while observing appropriate contact time as indicated for disinfecting solutions.    HPI Here for health maintenance exam and to review chronic medical problems    Feeling fair in general  Had some pain for the past week - she tends to have pain in R shoulder and arm (a little in her neck)  Better now- but still a little tingly in fingers   She thinks she lifted a 10 lb objects (foil roll) and that may have stopped it   Wt Readings from Last 3 Encounters:  12/11/19 152 lb 6 oz (69.1 kg)  11/15/18 139 lb 3 oz (63.1 kg)  10/21/17 131 lb 1.9 oz (59.5 kg)   28.10 kg/m   Occasional exercise- walks with her gmother twice a week  She would like to extend that by another 30 minutes   Diet is fair - lately skips lunch and eats a granola bar  Does eat fruit/veggies   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)  Learning a lot there - 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 and it is really helping  Wants to stay there for a while  Waiting for boyfriend graduates and then wants to move with him Public affairs consultant)    HIV /STD screening  Not interested in screening - monogamous /one partner   Interested in labs today   Tdap 5/10 -is due   Flu shot - did not get this year  Planning to get the covid vaccine   Pap 3/20 -neg with neg gc/chl screen  Taking sprintec 28 for OC No gyn symptoms  Menses - pretty manageable and regular / not too heavy or painful  Last about 5 days  Less stress helps as well as OC   Acne- is controlled OC helps  Nothing px otc  HPV vaccines -had entire series   Family history  Diabetes- PGF has type 1  No cancers that she knows of     Less anxious lately- not being in school  Has down days (pandemic has made that worse) Does not desire counseling or tx now   Patient Active Problem List   Diagnosis Date Noted  . Encounter for gynecological examination (general) (routine) without abnormal findings 11/15/2018  . Routine general medical examination at a health care facility 02/15/2017  . Screen for STD (sexually transmitted disease) 02/15/2017  . Dysmenorrhea 10/19/2013  . Acne 10/19/2013  . Well adolescent visit 10/13/2012   History reviewed. No pertinent past medical history. History reviewed. No pertinent surgical history. Social History   Tobacco Use  . Smoking status: Never Smoker  . Smokeless tobacco: Never Used  Substance Use Topics  . Alcohol use: Yes    Comment: occ  . Drug use: No   Family History  Problem Relation Age of Onset  . Diabetes Mellitus I Paternal Grandfather    Allergies  Allergen Reactions  . Penicillins    No current outpatient medications on file prior to visit.   No current facility-administered medications on file prior to visit.    Review of Systems  Constitutional: Negative for activity change, appetite change, fatigue, fever and unexpected weight change.  HENT: Negative for congestion, ear pain, rhinorrhea, sinus  pressure and sore throat.   Eyes: Negative for pain, redness and visual disturbance.  Respiratory: Negative for cough, shortness of breath and wheezing.   Cardiovascular: Negative for chest pain and palpitations.  Gastrointestinal: Negative for abdominal pain, blood in stool, constipation and diarrhea.  Endocrine: Negative for polydipsia and polyuria.  Genitourinary: Negative for dysuria, frequency and urgency.  Musculoskeletal: Negative for arthralgias, back pain and myalgias.  Skin: Negative for pallor and rash.       acne  Allergic/Immunologic: Negative for environmental allergies.  Neurological: Negative for dizziness, syncope and headaches.   Hematological: Negative for adenopathy. Does not bruise/bleed easily.  Psychiatric/Behavioral: Negative for decreased concentration and dysphoric mood. The patient is nervous/anxious.        Objective:   Physical Exam Constitutional:      General: She is not in acute distress.    Appearance: Normal appearance. She is well-developed and normal weight. She is not ill-appearing or diaphoretic.  HENT:     Head: Normocephalic and atraumatic.     Right Ear: Tympanic membrane, ear canal and external ear normal.     Left Ear: Tympanic membrane, ear canal and external ear normal.     Nose: Nose normal. No congestion.     Mouth/Throat:     Mouth: Mucous membranes are moist.     Pharynx: Oropharynx is clear. No posterior oropharyngeal erythema.  Eyes:     General: No scleral icterus.    Extraocular Movements: Extraocular movements intact.     Conjunctiva/sclera: Conjunctivae normal.     Pupils: Pupils are equal, round, and reactive to light.  Neck:     Thyroid: No thyromegaly.     Vascular: No carotid bruit or JVD.  Cardiovascular:     Rate and Rhythm: Normal rate and regular rhythm.     Pulses: Normal pulses.     Heart sounds: Normal heart sounds. No gallop.   Pulmonary:     Effort: Pulmonary effort is normal. No respiratory distress.     Breath sounds: Normal breath sounds. No wheezing.     Comments: Good air exch Chest:     Chest wall: No tenderness.  Abdominal:     General: Bowel sounds are normal. There is no distension or abdominal bruit.     Palpations: Abdomen is soft. There is no mass.     Tenderness: There is no abdominal tenderness.     Hernia: No hernia is present.  Genitourinary:    Comments: Breast exam: No mass, nodules, thickening, tenderness, bulging, retraction, inflamation, nipple discharge or skin changes noted.  No axillary or clavicular LA.     Musculoskeletal:        General: No tenderness. Normal range of motion.     Cervical back: Normal range of motion  and neck supple. No rigidity. No muscular tenderness.     Right lower leg: No edema.     Left lower leg: No edema.  Lymphadenopathy:     Cervical: No cervical adenopathy.  Skin:    General: Skin is warm and dry.     Coloration: Skin is not pale.     Findings: No erythema or rash.     Comments: Solar lentigines diffusely  Some brown nevi regular in shape  Mild inflammatory acne on lower face/chin  Neurological:     Mental Status: She is alert. Mental status is at baseline.     Cranial Nerves: No cranial nerve deficit.     Motor: No abnormal muscle tone.  Coordination: Coordination normal.     Gait: Gait normal.     Deep Tendon Reflexes: Reflexes are normal and symmetric. Reflexes normal.  Psychiatric:        Mood and Affect: Mood normal.     Comments: Pleasant            Assessment & Plan:   Problem List Items Addressed This Visit      Musculoskeletal and Integument   Acne    Controlled primarily with OC  Has not seen dermatology in over a year Worse on chin from mask Using some tea tree products otc  Disc imp of sun protection       Relevant Medications   norgestimate-ethinyl estradiol (SPRINTEC 28) 0.25-35 MG-MCG tablet     Genitourinary   Dysmenorrhea    Much improved with current OC/ sprintek Plans to continue it          Other   Routine general medical examination at a health care facility - Primary    Reviewed health habits including diet and exercise and skin cancer prevention Reviewed appropriate screening tests for age  Also reviewed health mt list, fam hx and immunization status , as well as social and family history   See HPI Labs for wellness today  Declines need for STD screen (in long term relationship and monogamous)  Tdap given today  utd pap a year ago  Plans to get a covid vaccine  Discussed importance of regular exercise -this may also help mood  Pt has some positive answers for PHQ- reviewed this /feels stress from pandemic but  does not feel she needs treatment at this time  Urged her to let us know if she changes her mind      Relevant Orders   CBC with Differential/Platelet   Comprehensive metabolic panel   Lipid panel   TSH

## 2019-12-11 NOTE — Patient Instructions (Addendum)
Get a covid vaccine when you can    Start walking more and  For longer amounts of time  Aim for 5 days of exercise for at least 30 minutes  Videos are a good option for home   Eat a balanced diet  Use sun protection when needed   Tetanus shot today   Labs for wellness today   Let us know if you ever want to talk to a counselor about stress or mood

## 2020-02-02 ENCOUNTER — Ambulatory Visit: Payer: BC Managed Care – PPO | Attending: Internal Medicine

## 2020-02-02 ENCOUNTER — Other Ambulatory Visit: Payer: Self-pay

## 2020-02-02 DIAGNOSIS — Z23 Encounter for immunization: Secondary | ICD-10-CM

## 2020-02-02 NOTE — Progress Notes (Signed)
   Covid-19 Vaccination Clinic  Name:  MALAISHA SILLIMAN    MRN: [id number] DOB: 10-Apr-1996  02/02/2020  Ms. Withem was observed post Covid-19 immunization for 15 minutes without incident. She was provided with Vaccine Information Sheet and instruction to access the V-Safe system.   Ms. Moffitt was instructed to call 911 with any severe reactions post vaccine: Marland Kitchen Difficulty breathing  . Swelling of face and throat  . A fast heartbeat  . A bad rash all over body  . Dizziness and weakness   Immunizations Administered    Name Date Dose VIS Date Route   Pfizer COVID-19 Vaccine 02/02/2020  8:06 AM 0.3 mL 10/31/2018 Intramuscular   Manufacturer: [employer], [employer]   Lot: [id number]   NDC: [address]-4

## 2020-02-23 ENCOUNTER — Ambulatory Visit: Payer: BC Managed Care – PPO | Attending: Internal Medicine

## 2020-02-23 DIAGNOSIS — Z23 Encounter for immunization: Secondary | ICD-10-CM

## 2020-02-23 NOTE — Progress Notes (Signed)
° °  Covid-19 Vaccination Clinic  Name:  Danielle Perry    MRN: [id number] DOB: [birth year]  02/23/2020  Danielle Perry was observed post Covid-19 immunization for 15 minutes without incident. She was provided with Vaccine Information Sheet and instruction to access the V-Safe system.   Danielle Perry was instructed to call 911 with any severe reactions post vaccine:  Difficulty breathing   Swelling of face and throat   A fast heartbeat   A bad rash all over body   Dizziness and weakness   Immunizations Administered    Name Date Dose VIS Date Route   Pfizer COVID-19 Vaccine 02/23/2020  7:55 AM 0.3 mL 10/31/2018 Intramuscular   Manufacturer: [employer], [employer]   Lot: [id number]   NDC: [address]-4

## 2020-05-01 ENCOUNTER — Ambulatory Visit: Payer: BC Managed Care – PPO | Admitting: Family Medicine

## 2020-05-01 ENCOUNTER — Encounter: Payer: Self-pay | Admitting: Family Medicine

## 2020-05-01 ENCOUNTER — Other Ambulatory Visit: Payer: Self-pay

## 2020-05-01 DIAGNOSIS — L03116 Cellulitis of left lower limb: Secondary | ICD-10-CM

## 2020-05-01 DIAGNOSIS — L0390 Cellulitis, unspecified: Secondary | ICD-10-CM | POA: Insufficient documentation

## 2020-05-01 MED ORDER — CLINDAMYCIN HCL 300 MG PO CAPS: 300.0000 mg | ORAL_CAPSULE | Freq: Three times a day (TID) | ORAL | 0 refills | Status: DC

## 2020-05-01 NOTE — Patient Instructions (Addendum)
I think you have an infected skin follicle that spread to the surrounding skin  Keep clean with soap and water  Do not shave until better   Take clindamycin as directed for a week  If any problems let me know  Use a warm compress when you can / elevate leg when you sit   Keep lightly covered   Update if not starting to improve in a week or if worsening

## 2020-05-01 NOTE — Progress Notes (Signed)
Subjective:    Patient ID: Danielle Perry, female    DOB: [birth year], 24 y.o.   MRN: [id number]  This visit occurred during the SARS-CoV-2 public health emergency.  Safety protocols were in place, including screening questions prior to the visit, additional usage of staff PPE, and extensive cleaning of exam room while observing appropriate contact time as indicated for disinfecting solutions.    HPI Pt presents for a bump on L knee  Noticed 4 days ago  Red and swollen  No itching  Tender  Hurts to bend/squat  Some drainage   There was an ingrown hair that she pulled out yesterday  May have been worse before that   No trauma  Has not shaved legs since Friday Has not been swimming   No tick bites that she knows of  Not a lot of mosquito bites   Patient Active Problem List   Diagnosis Date Noted  . Cellulitis 05/01/2020  . Encounter for gynecological examination (general) (routine) without abnormal findings 11/15/2018  . Routine general medical examination at a health care facility 02/15/2017  . Screen for STD (sexually transmitted disease) 02/15/2017  . Dysmenorrhea 10/19/2013  . Acne 10/19/2013  . Well adolescent visit 10/13/2012   History reviewed. No pertinent past medical history. History reviewed. No pertinent surgical history. Social History   Tobacco Use  . Smoking status: Never Smoker  . Smokeless tobacco: Never Used  Substance Use Topics  . Alcohol use: Yes    Comment: occ  . Drug use: No   Family History  Problem Relation Age of Onset  . Diabetes Mellitus I Paternal Grandfather    Allergies  Allergen Reactions  . Penicillins    Current Outpatient Medications on File Prior to Visit  Medication Sig Dispense Refill  . norgestimate-ethinyl estradiol (SPRINTEC 28) 0.25-35 MG-MCG tablet Take 1 tablet by mouth daily. 84 tablet 3   No current facility-administered medications on file prior to visit.    Review of Systems  Constitutional: Negative for  activity change, appetite change, fatigue, fever and unexpected weight change.  HENT: Negative for congestion, ear pain, rhinorrhea, sinus pressure and sore throat.   Eyes: Negative for pain, redness and visual disturbance.  Respiratory: Negative for cough, shortness of breath and wheezing.   Cardiovascular: Negative for chest pain and palpitations.  Gastrointestinal: Negative for abdominal pain, blood in stool, constipation and diarrhea.  Endocrine: Negative for polydipsia and polyuria.  Genitourinary: Negative for dysuria, frequency and urgency.  Musculoskeletal: Negative for arthralgias, back pain and myalgias.  Skin: Positive for wound. Negative for pallor and rash.  Allergic/Immunologic: Negative for environmental allergies.  Neurological: Negative for dizziness, syncope and headaches.  Hematological: Negative for adenopathy. Does not bruise/bleed easily.  Psychiatric/Behavioral: Negative for decreased concentration and dysphoric mood. The patient is not nervous/anxious.        Objective:   Physical Exam Constitutional:      General: She is not in acute distress.    Appearance: Normal appearance. She is normal weight. She is not ill-appearing.  Eyes:     General:        Right eye: No discharge.        Left eye: No discharge.     Conjunctiva/sclera: Conjunctivae normal.     Pupils: Pupils are equal, round, and reactive to light.  Cardiovascular:     Rate and Rhythm: Regular rhythm.  Pulmonary:     Effort: Pulmonary effort is normal. No respiratory distress.  Lymphadenopathy:  Cervical: No cervical adenopathy.  Skin:    General: Skin is warm and dry.     Findings: Erythema present. No rash.     Comments: 4 by 6 cm oval area of erythema and induration under L knee (no streaking) that is tender Scab area in middle leaks old blood (no pus)    Neurological:     Mental Status: She is alert.     Sensory: No sensory deficit.  Psychiatric:        Mood and Affect: Mood  normal.           Assessment & Plan:   Problem List Items Addressed This Visit      Other   Cellulitis    Left leg under knee - 6 by 4 cm area of redness and swelling with scab in the middle and some drainage of old blood  Per pt -started as ingrown hair she removed  Suspect folliculitis that spread Will tx with clindamycin (pxn allergic)  Soap/water Elevate/warm compress Watch for inc in swelling/red/pain or drainage Update if not starting to improve in a week or if worsening   inst not to shave

## 2020-05-01 NOTE — Assessment & Plan Note (Signed)
Left leg under knee - 6 by 4 cm area of redness and swelling with scab in the middle and some drainage of old blood  Per pt -started as ingrown hair she removed  Suspect folliculitis that spread Will tx with clindamycin (pxn allergic)  Soap/water Elevate/warm compress Watch for inc in swelling/red/pain or drainage Update if not starting to improve in a week or if worsening   inst not to shave

## 2020-06-20 ENCOUNTER — Other Ambulatory Visit: Payer: Self-pay

## 2020-06-20 ENCOUNTER — Ambulatory Visit: Payer: BC Managed Care – PPO | Admitting: Family Medicine

## 2020-06-20 ENCOUNTER — Encounter: Payer: Self-pay | Admitting: Family Medicine

## 2020-06-20 DIAGNOSIS — F4320 Adjustment disorder, unspecified: Secondary | ICD-10-CM | POA: Insufficient documentation

## 2020-06-20 DIAGNOSIS — F430 Acute stress reaction: Secondary | ICD-10-CM | POA: Diagnosis not present

## 2020-06-20 MED ORDER — SERTRALINE HCL 50 MG PO TABS: 50.0000 mg | ORAL_TABLET | Freq: Every day | ORAL | 3 refills | Status: DC

## 2020-06-20 NOTE — Progress Notes (Signed)
Subjective:    Patient ID: Danielle Perry, female    DOB: [birth year], 24 y.o.   MRN: [id number]  This visit occurred during the SARS-CoV-2 public health emergency.  Safety protocols were in place, including screening questions prior to the visit, additional usage of staff PPE, and extensive cleaning of exam room while observing appropriate contact time as indicated for disinfecting solutions.    HPI Pt presents with c/o stress causing anxiety   Wt Readings from Last 3 Encounters:  06/20/20 144 lb 3 oz (65.4 kg)  05/01/20 145 lb 2 oz (65.8 kg)  12/11/19 152 lb 6 oz (69.1 kg)  26.59 kg/m   Work stress and home stress both   She works as Ship broker for [employer]  Loading more and more work on her- overwhelmed  Not a lot of support  She is looking for a position in pre press  Looking at other places  At home - has issues with her mother - (the way she talks to her)  Not a lot of freedom  Really wants to move out- cannot afford to now   Not enough sleep  (bed at 10 / up at 5-5:30 ) Takes a while to fall asleep Not enough sleep for her  Busy  occ forgets to take showers and forgets to take OC   Does walking for exercise- physical job  Not much sitting  Some days- no time to eat / otherwise has to eat fast  Some 10 hour shifts and weekends   fam hx  Mother has depression   Feels jittery and fidgity  Racing mind (hard to control) - sometimes negative Hard to concentrate at work most times This is worse if she gets overwhelmed -- misplaces things   More anxious than depressed  Has never had a counselor  Talks to her boyfriend-good listener  Sometimes her dad also   No medicines in the past   No SI   Patient Active Problem List   Diagnosis Date Noted  . Stress reaction 06/20/2020  . Cellulitis 05/01/2020  . Encounter for gynecological examination (general) (routine) without abnormal findings 11/15/2018  . Routine general medical examination at a health  care facility 02/15/2017  . Screen for STD (sexually transmitted disease) 02/15/2017  . Dysmenorrhea 10/19/2013  . Acne 10/19/2013  . Well adolescent visit 10/13/2012   History reviewed. No pertinent past medical history. History reviewed. No pertinent surgical history. Social History   Tobacco Use  . Smoking status: Never Smoker  . Smokeless tobacco: Never Used  Substance Use Topics  . Alcohol use: Yes    Comment: occ  . Drug use: No   Family History  Problem Relation Age of Onset  . Diabetes Mellitus I Paternal Grandfather    Allergies  Allergen Reactions  . Penicillins    Current Outpatient Medications on File Prior to Visit  Medication Sig Dispense Refill  . norgestimate-ethinyl estradiol (SPRINTEC 28) 0.25-35 MG-MCG tablet Take 1 tablet by mouth daily. 84 tablet 3   No current facility-administered medications on file prior to visit.    Review of Systems  Constitutional: Positive for fatigue. Negative for activity change, appetite change, fever and unexpected weight change.  HENT: Negative for congestion, ear pain, rhinorrhea, sinus pressure and sore throat.   Eyes: Negative for pain, redness and visual disturbance.  Respiratory: Negative for cough, shortness of breath and wheezing.   Cardiovascular: Negative for chest pain and palpitations.  Gastrointestinal: Negative for abdominal pain, blood in stool,  constipation and diarrhea.  Endocrine: Negative for polydipsia and polyuria.  Genitourinary: Negative for dysuria, frequency and urgency.  Musculoskeletal: Negative for arthralgias, back pain and myalgias.  Skin: Negative for pallor and rash.  Allergic/Immunologic: Negative for environmental allergies.  Neurological: Negative for dizziness, syncope and headaches.  Hematological: Negative for adenopathy. Does not bruise/bleed easily.  Psychiatric/Behavioral: Positive for decreased concentration and sleep disturbance. Negative for dysphoric mood, self-injury and  suicidal ideas. The patient is nervous/anxious.        Objective:   Physical Exam Constitutional:      General: She is not in acute distress.    Appearance: Normal appearance. She is normal weight. She is not ill-appearing.     Comments: slim  Eyes:     Conjunctiva/sclera: Conjunctivae normal.     Pupils: Pupils are equal, round, and reactive to light.  Cardiovascular:     Rate and Rhythm: Normal rate and regular rhythm.     Heart sounds: Normal heart sounds.  Pulmonary:     Effort: Pulmonary effort is normal. No respiratory distress.     Breath sounds: Normal breath sounds.  Skin:    General: Skin is warm and dry.     Coloration: Skin is not pale.     Findings: No bruising.  Neurological:     Mental Status: She is alert.     Motor: No weakness.     Comments: No tremor  Psychiatric:        Mood and Affect: Mood is anxious. Affect is tearful.        Speech: Speech normal.        Behavior: Behavior normal.     Comments: occ tearful           Assessment & Plan:   Problem List Items Addressed This Visit      Other   Stress reaction    With stressors at home and school  Reviewed stressors/ coping techniques/symptoms/ support sources/ tx options and side effects in detail today  Her symptoms are primarily that of anxiety  Counseling referral made  Px zoloft 25 mg in pm to titrate up to 50 if well tolerated  Discussed expectations of SSRI medication including time to effectiveness and mechanism of action, also poss of side effects (early and late)- including mental fuzziness, weight or appetite change, nausea and poss of worse dep or anxiety (even suicidal thoughts)  Pt voiced understanding and will stop med and update if this occurs   Disc family history  Disc imp of self care and exercise - also usefulness of meditation (apps help)  F/u in about a month      Relevant Medications   sertraline (ZOLOFT) 50 MG tablet   Other Relevant Orders   Ambulatory referral to  Psychology

## 2020-06-20 NOTE — Patient Instructions (Addendum)
I placed a counseling referral  The office will call you to set this up   Start zoloft 1/2 pill daily at evening for 1-2 weeks ten if feeling ok go up to 1 pill daily  If you have side effects that don't go away or if you feel worse please let me know    Follow up in about a month  Try to get outdoors some  Work on job searching  Stay active  Eat a healthy diet    Meditation helps  The Calm app or the Headspace app are helpful   Avoid excessive caffeine

## 2020-06-22 NOTE — Assessment & Plan Note (Signed)
With stressors at home and school  Reviewed stressors/ coping techniques/symptoms/ support sources/ tx options and side effects in detail today  Her symptoms are primarily that of anxiety  Counseling referral made  Px zoloft 25 mg in pm to titrate up to 50 if well tolerated  Discussed expectations of SSRI medication including time to effectiveness and mechanism of action, also poss of side effects (early and late)- including mental fuzziness, weight or appetite change, nausea and poss of worse dep or anxiety (even suicidal thoughts)  Pt voiced understanding and will stop med and update if this occurs   Disc family history  Disc imp of self care and exercise - also usefulness of meditation (apps help)  F/u in about a month

## 2020-07-08 ENCOUNTER — Ambulatory Visit (INDEPENDENT_AMBULATORY_CARE_PROVIDER_SITE_OTHER): Payer: BC Managed Care – PPO | Admitting: Psychology

## 2020-07-08 DIAGNOSIS — F4323 Adjustment disorder with mixed anxiety and depressed mood: Secondary | ICD-10-CM

## 2020-07-22 ENCOUNTER — Ambulatory Visit (INDEPENDENT_AMBULATORY_CARE_PROVIDER_SITE_OTHER): Payer: BC Managed Care – PPO | Admitting: Psychology

## 2020-07-22 DIAGNOSIS — F4323 Adjustment disorder with mixed anxiety and depressed mood: Secondary | ICD-10-CM | POA: Diagnosis not present

## 2020-07-25 ENCOUNTER — Ambulatory Visit: Payer: BC Managed Care – PPO | Admitting: Family Medicine

## 2020-07-25 ENCOUNTER — Encounter: Payer: Self-pay | Admitting: Family Medicine

## 2020-07-25 ENCOUNTER — Other Ambulatory Visit: Payer: Self-pay

## 2020-07-25 VITALS — BP 114/72 | HR 93 | Temp 97.6°F | Ht 61.75 in | Wt 144.4 lb

## 2020-07-25 DIAGNOSIS — F430 Acute stress reaction: Secondary | ICD-10-CM | POA: Diagnosis not present

## 2020-07-25 MED ORDER — SERTRALINE HCL 50 MG PO TABS: 50.0000 mg | ORAL_TABLET | Freq: Every day | ORAL | 3 refills | Status: DC

## 2020-07-25 NOTE — Assessment & Plan Note (Signed)
Doing much better with counseling and zoloft 50 mg daily  Also new job offer so outlook is bright  Reviewed stressors/ coping techniques/symptoms/ support sources/ tx options and side effects in detail today Enc to continue counseling Refilled zoloft  inst to let us know if she feels we need to change dose in future No side eff

## 2020-07-25 NOTE — Progress Notes (Signed)
Subjective:    Patient ID: Danielle Perry, female    DOB: [birth year], 24 y.o.   MRN: [id number]  This visit occurred during the SARS-CoV-2 public health emergency.  Safety protocols were in place, including screening questions prior to the visit, additional usage of staff PPE, and extensive cleaning of exam room while observing appropriate contact time as indicated for disinfecting solutions.    HPI Pt presents for f/u of stress reaction   Wt Readings from Last 3 Encounters:  07/25/20 144 lb 6 oz (65.5 kg)  06/20/20 144 lb 3 oz (65.4 kg)  05/01/20 145 lb 2 oz (65.8 kg)   26.62 kg/m  Doing better   Has started counseling- 2 visits so far  She talks a lot  Makes her feel a lot better   Going to bed more early 9pm- sleeps to 5  The medication is helping  Is able to relax much more  Last 2 weeks especially- less down / more of a bright outlook   Getting a little exercise (but walks a lot at work)   No side effects from the medicine  Wants to stay on this dose   Appetite is fair - more hungry for sweets lately but not too bad   Got a job offer yesterday- very excited  At [employer]- very nice people    Patient Active Problem List   Diagnosis Date Noted  . Stress reaction 06/20/2020  . Cellulitis 05/01/2020  . Encounter for gynecological examination (general) (routine) without abnormal findings 11/15/2018  . Routine general medical examination at a health care facility 02/15/2017  . Screen for STD (sexually transmitted disease) 02/15/2017  . Dysmenorrhea 10/19/2013  . Acne 10/19/2013  . Well adolescent visit 10/13/2012   History reviewed. No pertinent past medical history. History reviewed. No pertinent surgical history. Social History   Tobacco Use  . Smoking status: Never Smoker  . Smokeless tobacco: Never Used  Substance Use Topics  . Alcohol use: Yes    Comment: occ  . Drug use: No   Family History  Problem Relation Age of Onset  . Diabetes  Mellitus I Paternal Grandfather    Allergies  Allergen Reactions  . Penicillins    Current Outpatient Medications on File Prior to Visit  Medication Sig Dispense Refill  . norgestimate-ethinyl estradiol (SPRINTEC 28) 0.25-35 MG-MCG tablet Take 1 tablet by mouth daily. 84 tablet 3   No current facility-administered medications on file prior to visit.    Review of Systems  Constitutional: Negative for activity change, appetite change, fatigue, fever and unexpected weight change.  HENT: Negative for congestion, ear pain, rhinorrhea, sinus pressure and sore throat.   Eyes: Negative for pain, redness and visual disturbance.  Respiratory: Negative for cough, shortness of breath and wheezing.   Cardiovascular: Negative for chest pain and palpitations.  Gastrointestinal: Negative for abdominal pain, blood in stool, constipation and diarrhea.  Endocrine: Negative for polydipsia and polyuria.  Genitourinary: Negative for dysuria, frequency and urgency.  Musculoskeletal: Negative for arthralgias, back pain and myalgias.  Skin: Negative for pallor and rash.  Allergic/Immunologic: Negative for environmental allergies.  Neurological: Negative for dizziness, syncope and headaches.  Hematological: Negative for adenopathy. Does not bruise/bleed easily.  Psychiatric/Behavioral: Negative for decreased concentration, dysphoric mood, self-injury, sleep disturbance and suicidal ideas. The patient is nervous/anxious.        Much improved mood  Sleeping better        Objective:   Physical Exam Constitutional:  General: She is not in acute distress.    Appearance: Normal appearance. She is normal weight. She is not ill-appearing or diaphoretic.  HENT:     Mouth/Throat:     Mouth: Mucous membranes are moist.  Eyes:     Conjunctiva/sclera: Conjunctivae normal.     Pupils: Pupils are equal, round, and reactive to light.  Cardiovascular:     Rate and Rhythm: Regular rhythm. Tachycardia present.    Pulmonary:     Effort: Pulmonary effort is normal. No respiratory distress.  Musculoskeletal:        General: No swelling.  Skin:    General: Skin is warm and dry.     Coloration: Skin is not pale.     Findings: No erythema.  Neurological:     Mental Status: She is alert.     Sensory: No sensory deficit.     Coordination: Coordination normal.     Comments: No tremor   Psychiatric:        Mood and Affect: Mood normal.           Assessment & Plan:   Problem List Items Addressed This Visit      Other   Stress reaction - Primary    Doing much better with counseling and zoloft 50 mg daily  Also new job offer so outlook is bright  Reviewed stressors/ coping techniques/symptoms/ support sources/ tx options and side effects in detail today Enc to continue counseling Refilled zoloft  inst to let us know if she feels we need to change dose in future No side eff       Relevant Medications   sertraline (ZOLOFT) 50 MG tablet

## 2020-07-25 NOTE — Patient Instructions (Addendum)
Good luck with the new job  Add in exercise when you can / get outdoors also  Stay on current zoloft dose  Let me know if you if you have any problems  Continue counseling also   Keep taking good care of yourself

## 2020-07-28 ENCOUNTER — Encounter: Payer: Self-pay | Admitting: Family Medicine

## 2020-08-07 ENCOUNTER — Ambulatory Visit (INDEPENDENT_AMBULATORY_CARE_PROVIDER_SITE_OTHER): Payer: BC Managed Care – PPO | Admitting: Psychology

## 2020-08-07 DIAGNOSIS — F4323 Adjustment disorder with mixed anxiety and depressed mood: Secondary | ICD-10-CM

## 2020-08-13 ENCOUNTER — Encounter: Payer: Self-pay | Admitting: Family Medicine

## 2020-08-13 ENCOUNTER — Other Ambulatory Visit: Payer: Self-pay

## 2020-08-13 ENCOUNTER — Ambulatory Visit: Payer: BC Managed Care – PPO | Admitting: Family Medicine

## 2020-08-13 DIAGNOSIS — L03019 Cellulitis of unspecified finger: Secondary | ICD-10-CM | POA: Insufficient documentation

## 2020-08-13 DIAGNOSIS — L03011 Cellulitis of right finger: Secondary | ICD-10-CM | POA: Diagnosis not present

## 2020-08-13 MED ORDER — CLINDAMYCIN HCL 300 MG PO CAPS: 300.0000 mg | ORAL_CAPSULE | Freq: Three times a day (TID) | ORAL | 0 refills | Status: DC

## 2020-08-13 NOTE — Patient Instructions (Signed)
Soak your finger in warm water with a squirt of iodine for 10 minutes when able  If it drains-keep it clean Soap and water   Take the clindamycin three times daily for 10 days  You can stop after 7 days if totally better   Watch for increased redness/swelling /pain and let us know   Antibiotic ointment like polysporin or triple is fine   Update if not starting to improve in a week or if worsening    Keep hands clean  Avoid trauma  Moisturize

## 2020-08-13 NOTE — Progress Notes (Signed)
Subjective:    Patient ID: Danielle Perry, female    DOB: 1996-03-25, 24 y.o.   MRN: [id number]  This visit occurred during the SARS-CoV-2 public health emergency.  Safety protocols were in place, including screening questions prior to the visit, additional usage of staff PPE, and extensive cleaning of exam room while observing appropriate contact time as indicated for disinfecting solutions.    HPI Pt presents for an ? Infected finger nail R middle finger  Wt Readings from Last 3 Encounters:  08/13/20 145 lb 2 oz (65.8 kg)  07/25/20 144 lb 6 oz (65.5 kg)  06/20/20 144 lb 3 oz (65.4 kg)   26.76 kg/m  R middle finger  Started Monday (had a cut last week- from paper- scab  The week before)  She had unroofed a scab  Started to swell on Monday so she cleaned it well (soap and water)  Then became painful  Some heat   Did have some white drainage and that helped pain  Worse at the inner and base of nail  Nail looks normal to her  Throbs and tender also   This all made her anxious   No fever  Feels ok   Patient Active Problem List   Diagnosis Date Noted  . Paronychia of finger 08/13/2020  . Stress reaction 06/20/2020  . Cellulitis 05/01/2020  . Encounter for gynecological examination (general) (routine) without abnormal findings 11/15/2018  . Routine general medical examination at a health care facility 02/15/2017  . Screen for STD (sexually transmitted disease) 02/15/2017  . Dysmenorrhea 10/19/2013  . Acne 10/19/2013  . Well adolescent visit 10/13/2012   History reviewed. No pertinent past medical history. History reviewed. No pertinent surgical history. Social History   Tobacco Use  . Smoking status: Never Smoker  . Smokeless tobacco: Never Used  Substance Use Topics  . Alcohol use: Yes    Comment: occ  . Drug use: No   Family History  Problem Relation Age of Onset  . Diabetes Mellitus I Paternal Grandfather    Allergies  Allergen Reactions  . Penicillins     Current Outpatient Medications on File Prior to Visit  Medication Sig Dispense Refill  . norgestimate-ethinyl estradiol (SPRINTEC 28) 0.25-35 MG-MCG tablet Take 1 tablet by mouth daily. 84 tablet 3  . sertraline (ZOLOFT) 50 MG tablet Take 1 tablet (50 mg total) by mouth daily. 90 tablet 3   No current facility-administered medications on file prior to visit.    Review of Systems  Constitutional: Negative for activity change, appetite change, fatigue, fever and unexpected weight change.  HENT: Negative for congestion, ear pain, rhinorrhea, sinus pressure and sore throat.   Eyes: Negative for pain, redness and visual disturbance.  Respiratory: Negative for cough, shortness of breath and wheezing.   Cardiovascular: Negative for chest pain and palpitations.  Gastrointestinal: Negative for abdominal pain, blood in stool, constipation and diarrhea.  Endocrine: Negative for polydipsia and polyuria.  Genitourinary: Negative for dysuria, frequency and urgency.  Musculoskeletal: Negative for arthralgias, back pain and myalgias.  Skin: Negative for pallor and rash.       Pain and swelling of skin of finger around nail  Allergic/Immunologic: Negative for environmental allergies.  Neurological: Negative for dizziness, syncope and headaches.  Hematological: Negative for adenopathy. Does not bruise/bleed easily.  Psychiatric/Behavioral: Negative for decreased concentration and dysphoric mood. The patient is not nervous/anxious.        Objective:   Physical Exam Constitutional:      Appearance:  Normal appearance.  Cardiovascular:     Pulses: Normal pulses.  Musculoskeletal:        General: Tenderness present. Normal range of motion.  Skin:    General: Skin is warm and dry.     Findings: Erythema present. No bruising or rash.     Comments: Right 3rd finger  Erythema and swelling around nail-worse medially  Some dried discharge (not actively draining) and scab  Mildly tender Nl sens and  rom and perfusion   Nail itself looks normal    Neurological:     Mental Status: She is alert.     Sensory: No sensory deficit.  Psychiatric:        Mood and Affect: Mood normal.           Assessment & Plan:   Problem List Items Addressed This Visit      Musculoskeletal and Integument   Paronychia of finger    Right middle finger - medial edge of nail with swelling and scab (formerly discharge)  Suspect bacterial  Disc cleaning well with soap and water and protect from trauma (can wrap if needed loosely)  Soak in iodine/water (warm)  otc abx ointment  Px clindamycin 300 mg tid for 10 d (7d if better)  inst to call if worse (red/swelling/pain or streaks)  Update if not starting to improve in a week or if worsening   To prevent in future inst to keep hands clean and avoid trauma to nails skin and cuticles       Relevant Medications   clindamycin (CLEOCIN) 300 MG capsule

## 2020-08-13 NOTE — Assessment & Plan Note (Signed)
Right middle finger - medial edge of nail with swelling and scab (formerly discharge)  Suspect bacterial  Disc cleaning well with soap and water and protect from trauma (can wrap if needed loosely)  Soak in iodine/water (warm)  otc abx ointment  Px clindamycin 300 mg tid for 10 d (7d if better)  inst to call if worse (red/swelling/pain or streaks)  Update if not starting to improve in a week or if worsening   To prevent in future inst to keep hands clean and avoid trauma to nails skin and cuticles

## 2020-08-26 ENCOUNTER — Ambulatory Visit (INDEPENDENT_AMBULATORY_CARE_PROVIDER_SITE_OTHER): Payer: BC Managed Care – PPO | Admitting: Psychology

## 2020-08-26 DIAGNOSIS — F4323 Adjustment disorder with mixed anxiety and depressed mood: Secondary | ICD-10-CM

## 2020-09-09 ENCOUNTER — Ambulatory Visit (INDEPENDENT_AMBULATORY_CARE_PROVIDER_SITE_OTHER): Payer: BC Managed Care – PPO | Admitting: Psychology

## 2020-09-09 DIAGNOSIS — F4323 Adjustment disorder with mixed anxiety and depressed mood: Secondary | ICD-10-CM | POA: Diagnosis not present

## 2020-09-18 ENCOUNTER — Other Ambulatory Visit: Payer: Self-pay

## 2020-09-18 DIAGNOSIS — Z20822 Contact with and (suspected) exposure to covid-19: Secondary | ICD-10-CM

## 2020-09-20 LAB — NOVEL CORONAVIRUS, NAA: SARS-CoV-2, NAA: NOT DETECTED

## 2020-09-20 LAB — SARS-COV-2, NAA 2 DAY TAT

## 2020-09-23 ENCOUNTER — Ambulatory Visit (INDEPENDENT_AMBULATORY_CARE_PROVIDER_SITE_OTHER): Payer: BC Managed Care – PPO | Admitting: Psychology

## 2020-09-23 DIAGNOSIS — F4323 Adjustment disorder with mixed anxiety and depressed mood: Secondary | ICD-10-CM | POA: Diagnosis not present

## 2020-10-21 ENCOUNTER — Ambulatory Visit (INDEPENDENT_AMBULATORY_CARE_PROVIDER_SITE_OTHER): Payer: BC Managed Care – PPO | Admitting: Psychology

## 2020-10-21 DIAGNOSIS — F4323 Adjustment disorder with mixed anxiety and depressed mood: Secondary | ICD-10-CM

## 2020-11-18 ENCOUNTER — Ambulatory Visit (INDEPENDENT_AMBULATORY_CARE_PROVIDER_SITE_OTHER): Payer: BC Managed Care – PPO | Admitting: Psychology

## 2020-11-18 DIAGNOSIS — F4323 Adjustment disorder with mixed anxiety and depressed mood: Secondary | ICD-10-CM | POA: Diagnosis not present

## 2020-11-19 ENCOUNTER — Encounter: Payer: BC Managed Care – PPO | Admitting: Family Medicine

## 2020-12-08 ENCOUNTER — Ambulatory Visit (INDEPENDENT_AMBULATORY_CARE_PROVIDER_SITE_OTHER): Payer: BC Managed Care – PPO | Admitting: Psychology

## 2020-12-08 DIAGNOSIS — F4323 Adjustment disorder with mixed anxiety and depressed mood: Secondary | ICD-10-CM

## 2020-12-15 ENCOUNTER — Ambulatory Visit (INDEPENDENT_AMBULATORY_CARE_PROVIDER_SITE_OTHER): Payer: BC Managed Care – PPO | Admitting: Psychology

## 2020-12-15 DIAGNOSIS — F4323 Adjustment disorder with mixed anxiety and depressed mood: Secondary | ICD-10-CM

## 2020-12-16 ENCOUNTER — Ambulatory Visit (INDEPENDENT_AMBULATORY_CARE_PROVIDER_SITE_OTHER): Payer: BC Managed Care – PPO | Admitting: Family Medicine

## 2020-12-16 ENCOUNTER — Encounter: Payer: Self-pay | Admitting: Family Medicine

## 2020-12-16 ENCOUNTER — Other Ambulatory Visit: Payer: Self-pay

## 2020-12-16 VITALS — BP 110/68 | HR 84 | Temp 97.2°F | Ht 61.75 in | Wt 157.6 lb

## 2020-12-16 DIAGNOSIS — N946 Dysmenorrhea, unspecified: Secondary | ICD-10-CM | POA: Diagnosis not present

## 2020-12-16 DIAGNOSIS — Z113 Encounter for screening for infections with a predominantly sexual mode of transmission: Secondary | ICD-10-CM

## 2020-12-16 DIAGNOSIS — Z0000 Encounter for general adult medical examination without abnormal findings: Secondary | ICD-10-CM

## 2020-12-16 LAB — LIPID PANEL
Cholesterol: 150 mg/dL (ref 0–200)
HDL: 57.1 mg/dL (ref >39.00)
LDL Cholesterol: 62 mg/dL (ref 0–99)
NonHDL: 93.05
Total CHOL/HDL Ratio: 3
Triglycerides: 153 mg/dL — ABNORMAL HIGH (ref 0.0–149.0)
VLDL: 30.6 mg/dL (ref 0.0–40.0)

## 2020-12-16 LAB — COMPREHENSIVE METABOLIC PANEL
ALT: 13 U/L (ref 0–35)
AST: 13 U/L (ref 0–37)
Albumin: 3.9 g/dL (ref 3.5–5.2)
Alkaline Phosphatase: 61 U/L (ref 39–117)
BUN: 11 mg/dL (ref 6–23)
CO2: 27 mEq/L (ref 19–32)
Calcium: 9.3 mg/dL (ref 8.4–10.5)
Chloride: 101 mEq/L (ref 96–112)
Creatinine, Ser: 0.63 mg/dL (ref 0.40–1.20)
GFR: 124.29 mL/min (ref >60.00)
Glucose, Bld: 74 mg/dL (ref 70–99)
Potassium: 4.1 mEq/L (ref 3.5–5.1)
Sodium: 136 mEq/L (ref 135–145)
Total Bilirubin: 0.4 mg/dL (ref 0.2–1.2)
Total Protein: 6.8 g/dL (ref 6.0–8.3)

## 2020-12-16 LAB — CBC WITH DIFFERENTIAL/PLATELET
Basophils Absolute: 0 10*3/uL (ref 0.0–0.1)
Basophils Relative: 0.3 % (ref 0.0–3.0)
Eosinophils Absolute: 0.2 10*3/uL (ref 0.0–0.7)
Eosinophils Relative: 2.5 % (ref 0.0–5.0)
HCT: 39.6 % (ref 36.0–46.0)
Hemoglobin: 13.5 g/dL (ref 12.0–15.0)
Lymphocytes Relative: 24 % (ref 12.0–46.0)
Lymphs Abs: 1.7 10*3/uL (ref 0.7–4.0)
MCHC: 34 g/dL (ref 30.0–36.0)
MCV: 87.2 fl (ref 78.0–100.0)
Monocytes Absolute: 0.5 10*3/uL (ref 0.1–1.0)
Monocytes Relative: 6.3 % (ref 3.0–12.0)
Neutro Abs: 4.8 10*3/uL (ref 1.4–7.7)
Neutrophils Relative %: 66.9 % (ref 43.0–77.0)
Platelets: 295 10*3/uL (ref 150.0–400.0)
RBC: 4.54 Mil/uL (ref 3.87–5.11)
RDW: 12.6 % (ref 11.5–15.5)
WBC: 7.2 10*3/uL (ref 4.0–10.5)

## 2020-12-16 LAB — TSH: TSH: 1.78 u[IU]/mL (ref 0.35–4.50)

## 2020-12-16 MED ORDER — NORGESTIMATE-ETH ESTRADIOL 0.25-35 MG-MCG PO TABS: 1.0000 | ORAL_TABLET | Freq: Every day | ORAL | 3 refills | Status: DC

## 2020-12-16 NOTE — Patient Instructions (Addendum)
Get your covid booster whenever you can   Continue your current oral contraceptive  Continue the sertraline  Keep exercising Wear sunscreen   Labs today including STD screening

## 2020-12-16 NOTE — Assessment & Plan Note (Signed)
Reviewed health habits including diet and exercise and skin cancer prevention Reviewed appropriate screening tests for age  Also reviewed health mt list, fam hx and immunization status , as well as social and family history   See HPI Labs ordered for wellness STD screen today  utd pap  Enc pt to get covid booster

## 2020-12-16 NOTE — Progress Notes (Signed)
Subjective:    Patient ID: Danielle Perry, female    DOB: [birth year], 25 y.o.   MRN: [id number]  This visit occurred during the SARS-CoV-2 public health emergency.  Safety protocols were in place, including screening questions prior to the visit, additional usage of staff PPE, and extensive cleaning of exam room while observing appropriate contact time as indicated for disinfecting solutions.    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   Mood is overall not bad  Continues sertraline and it helps  If she misses a pill- can tell  Some stress with relationship with mom-improved a bit   Has been exercising regularly , some walking and [employer] videos    covid status- immunized -has not had booster /has not decided  No flu shot   Pap 3/20 negative with neg gc/chl screening  Had HPV vaccines   Interested in STD screen  Monogamous for over 4 years   Self breast exam -no lumps /she checks   Menses- not bad  Regular with the pill  Not very heavy or painful often  Taking sprintec OC -wants to stay on   Tdap 4/21  BP Readings from Last 3 Encounters:  12/16/20 110/68  08/13/20 108/68  07/25/20 114/72   Pulse Readings from Last 3 Encounters:  12/16/20 84  08/13/20 75  07/25/20 93   Patient Active Problem List   Diagnosis Date Noted  . Stress reaction 06/20/2020  . Encounter for gynecological examination (general) (routine) without abnormal findings 11/15/2018  . Routine general medical examination at a health care facility 02/15/2017  . Screen for STD (sexually transmitted disease) 02/15/2017  . Dysmenorrhea 10/19/2013  . Acne 10/19/2013  . Well adolescent visit  10/13/2012   History reviewed. No pertinent past medical history. History reviewed. No pertinent surgical history. Social History   Tobacco Use  . Smoking status: Never Smoker  . Smokeless tobacco: Never Used  Substance Use Topics  . Alcohol use: Yes    Comment: occ  . Drug use: No   Family History  Problem Relation Age of Onset  . Diabetes Mellitus I Paternal Grandfather    Allergies  Allergen Reactions  . Penicillins    Current Outpatient Medications on File Prior to Visit  Medication Sig Dispense Refill  . sertraline (ZOLOFT) 50 MG tablet Take 1 tablet (50 mg total) by mouth daily. 90 tablet 3   No current facility-administered medications on file prior to visit.    Review of Systems  Constitutional: Negative for activity change, appetite change, fatigue, fever and unexpected weight change.  HENT: Negative for congestion, ear pain, rhinorrhea, sinus pressure and sore throat.   Eyes: Negative for pain, redness and visual disturbance.  Respiratory: Negative for cough, shortness of breath and wheezing.   Cardiovascular: Negative for chest pain and palpitations.  Gastrointestinal: Negative for abdominal pain, blood in stool, constipation and diarrhea.  Endocrine: Negative for polydipsia and polyuria.  Genitourinary: Negative for dysuria, frequency and urgency.  Musculoskeletal: Negative for arthralgias, back pain and myalgias.  Skin: Negative for pallor and rash.  Allergic/Immunologic: Negative for environmental allergies.  Neurological: Negative for dizziness, syncope and headaches.  Hematological: Negative for adenopathy. Does not bruise/bleed easily.  Psychiatric/Behavioral: Negative for decreased concentration and dysphoric mood. The patient is not nervous/anxious.        Objective:   Physical Exam Constitutional:      General: She is not in acute distress.    Appearance: Normal appearance. She is well-developed. She is not ill-appearing or diaphoretic.      Comments: Overweight   HENT:     Head: Normocephalic and atraumatic.     Right Ear: Tympanic membrane, ear canal and external ear normal.     Left Ear: Tympanic membrane, ear canal and external ear normal.     Nose: Nose normal. No congestion.     Mouth/Throat:     Mouth: Mucous membranes are moist.     Pharynx: Oropharynx is clear. No posterior oropharyngeal erythema.  Eyes:     General: No scleral icterus.    Extraocular Movements: Extraocular movements intact.     Conjunctiva/sclera: Conjunctivae normal.     Pupils: Pupils are equal, round, and reactive to light.  Neck:     Thyroid: No thyromegaly.     Vascular: No carotid bruit or JVD.  Cardiovascular:     Rate and Rhythm: Normal rate and regular rhythm.     Pulses: Normal pulses.     Heart sounds: Normal heart sounds. No gallop.   Pulmonary:     Effort: Pulmonary effort is normal. No respiratory distress.     Breath sounds: Normal breath sounds. No wheezing.     Comments: Good air exch Chest:     Chest wall: No tenderness.  Abdominal:     General: Bowel sounds are normal. There is no distension or abdominal bruit.     Palpations: Abdomen is soft. There is no mass.     Tenderness: There is no abdominal tenderness.     Hernia: No hernia is present.  Genitourinary:    Comments: Breast exam: No mass, nodules, thickening, tenderness, bulging, retraction, inflamation, nipple discharge or skin changes noted.  No axillary or clavicular LA.     Musculoskeletal:        General: No tenderness. Normal range of motion.     Cervical back: Normal range of motion and neck supple. No rigidity. No muscular tenderness.     Right lower leg: No edema.     Left lower leg: No edema.  Lymphadenopathy:     Cervical: No cervical adenopathy.  Skin:    General: Skin is warm and dry.     Coloration: Skin is not pale.     Findings: No erythema or rash.     Comments: Solar lentigines diffusely   Neurological:     Mental Status: She is alert.  Mental status is at baseline.     Cranial Nerves: No cranial nerve deficit.     Motor: No abnormal muscle tone.     Coordination: Coordination normal.     Gait: Gait normal.     Deep Tendon Reflexes: Reflexes are normal and symmetric. Reflexes normal.  Psychiatric:        Mood and Affect: Mood normal.     Comments: Pleasant            Assessment & Plan:   Problem List Items Addressed This Visit      Genitourinary   Dysmenorrhea    Better with current OC  Plans to continue this         Other  Routine general medical examination at a health care facility - Primary    Reviewed health habits including diet and exercise and skin cancer prevention Reviewed appropriate screening tests for age  Also reviewed health mt list, fam hx and immunization status , as well as social and family history   See HPI Labs ordered for wellness STD screen today  utd pap  Enc pt to get covid booster       Relevant Orders   CBC with Differential/Platelet (Completed)   Comprehensive metabolic panel (Completed)   Lipid panel (Completed)   TSH (Completed)   Screen for STD (sexually transmitted disease)    Monogamous and no symptoms  Labs done        Relevant Orders   HIV Antibody (routine testing w rflx)   Hepatitis C antibody   RPR   C. trachomatis/N. gonorrhoeae RNA

## 2020-12-16 NOTE — Assessment & Plan Note (Signed)
Better with current OC  Plans to continue this

## 2020-12-16 NOTE — Assessment & Plan Note (Signed)
Monogamous and no symptoms  Labs done

## 2020-12-17 LAB — C. TRACHOMATIS/N. GONORRHOEAE RNA
C. trachomatis RNA, TMA: NOT DETECTED
N. gonorrhoeae RNA, TMA: NOT DETECTED

## 2020-12-17 LAB — HEPATITIS C ANTIBODY
Hepatitis C Ab: NONREACTIVE
SIGNAL TO CUT-OFF: 0.01 (ref <1.00)

## 2020-12-17 LAB — HIV ANTIBODY (ROUTINE TESTING W REFLEX): HIV 1&2 Ab, 4th Generation: NONREACTIVE

## 2020-12-17 LAB — RPR: RPR Ser Ql: NONREACTIVE

## 2021-01-05 ENCOUNTER — Ambulatory Visit (INDEPENDENT_AMBULATORY_CARE_PROVIDER_SITE_OTHER): Payer: BC Managed Care – PPO | Admitting: Psychology

## 2021-01-05 DIAGNOSIS — F4323 Adjustment disorder with mixed anxiety and depressed mood: Secondary | ICD-10-CM | POA: Diagnosis not present

## 2021-02-03 ENCOUNTER — Ambulatory Visit (INDEPENDENT_AMBULATORY_CARE_PROVIDER_SITE_OTHER): Payer: BC Managed Care – PPO | Admitting: Psychology

## 2021-02-03 DIAGNOSIS — F4323 Adjustment disorder with mixed anxiety and depressed mood: Secondary | ICD-10-CM | POA: Diagnosis not present

## 2021-03-12 ENCOUNTER — Ambulatory Visit (INDEPENDENT_AMBULATORY_CARE_PROVIDER_SITE_OTHER): Payer: BC Managed Care – PPO | Admitting: Psychology

## 2021-03-12 DIAGNOSIS — F4323 Adjustment disorder with mixed anxiety and depressed mood: Secondary | ICD-10-CM | POA: Diagnosis not present

## 2021-04-09 ENCOUNTER — Encounter: Payer: Self-pay | Admitting: Family Medicine

## 2021-04-09 ENCOUNTER — Ambulatory Visit (INDEPENDENT_AMBULATORY_CARE_PROVIDER_SITE_OTHER): Payer: BC Managed Care – PPO | Admitting: Psychology

## 2021-04-09 DIAGNOSIS — F4323 Adjustment disorder with mixed anxiety and depressed mood: Secondary | ICD-10-CM

## 2021-05-13 ENCOUNTER — Ambulatory Visit (INDEPENDENT_AMBULATORY_CARE_PROVIDER_SITE_OTHER): Payer: BC Managed Care – PPO | Admitting: Psychology

## 2021-05-13 DIAGNOSIS — F4323 Adjustment disorder with mixed anxiety and depressed mood: Secondary | ICD-10-CM | POA: Diagnosis not present

## 2021-06-09 ENCOUNTER — Ambulatory Visit (INDEPENDENT_AMBULATORY_CARE_PROVIDER_SITE_OTHER): Payer: No Typology Code available for payment source | Admitting: Psychology

## 2021-06-09 DIAGNOSIS — F4323 Adjustment disorder with mixed anxiety and depressed mood: Secondary | ICD-10-CM

## 2021-07-13 ENCOUNTER — Ambulatory Visit (INDEPENDENT_AMBULATORY_CARE_PROVIDER_SITE_OTHER): Payer: No Typology Code available for payment source | Admitting: Psychology

## 2021-07-13 DIAGNOSIS — F4323 Adjustment disorder with mixed anxiety and depressed mood: Secondary | ICD-10-CM

## 2021-08-04 ENCOUNTER — Other Ambulatory Visit: Payer: Self-pay | Admitting: Family Medicine

## 2021-09-03 ENCOUNTER — Encounter: Payer: Self-pay | Admitting: Family Medicine

## 2021-09-10 ENCOUNTER — Ambulatory Visit (INDEPENDENT_AMBULATORY_CARE_PROVIDER_SITE_OTHER): Payer: No Typology Code available for payment source | Admitting: Psychology

## 2021-09-10 DIAGNOSIS — F4323 Adjustment disorder with mixed anxiety and depressed mood: Secondary | ICD-10-CM | POA: Diagnosis not present

## 2021-09-10 NOTE — Progress Notes (Signed)
[facility] Counselor/Therapist Progress Note  Patient ID: Danielle Perry, MRN: [id number],    Date: 09/10/2021  Time Spent: 12:01pm-12:46pm     Treatment Type: Individual Therapy  Pt is seen for a virtual video visit via webex.  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   Reported Symptoms: stress, anxiety, negative self talk  Mental Status Exam: Appearance:  Well Groomed     Behavior: Appropriate  Motor: Normal  Speech/Language:  Normal Rate  Affect: Appropriate  Mood: anxious  Thought process: normal  Thought content:   WNL  Sensory/Perceptual disturbances:   WNL  Orientation: oriented to person, place, time/date, and situation  Attention: Good  Concentration: Good  Memory: WNL  Fund of knowledge:  Good  Insight:   Good  Judgment:  Good  Impulse Control: Good   Risk Assessment: Danger to Self:  No Self-injurious Behavior: No Danger to Others: No Duty to Warn:no Physical Aggression / Violence:No  Access to Firearms a concern: No  Gang Involvement:No   Subjective: Counselor assessed pt current functioning per pt report.  Processed w/pt stressors w/ grandfather's health and w/ work errors.  Explored w/ pt negative self talk about mistakes and assisted in reframing.  Discussed encouraging self statements and process of learning.  Pt affect wnl.  Pt reported that her holidays were good, had positive interactions w/ family and boyfriend.  Pt reported no conflict w/mom this year about time at boyfriend's family.  Pt reported that she learned this week of some errors she was making at work and felt discouraged as thought doing everything perfect.  Pt was able to reflect on learning moment and problem solving for reducing in future errors.  Pt also discussed positive support from trainer.    Interventions: Cognitive Behavioral Therapy and Positive self talk  Diagnosis:Adjustment disorder with mixed anxiety and depressed mood  Plan: Pt to  f/u next month for counseling to assist coping w/ stressors.  Pt to f/u as scheduled w/ PCP.    Treatment Plan 07/13/21 Client Abilities/Strengths  supports- boyfriend, couple close friends enjoys drawing, reading, crocheting. 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 continue med management w/ PCP  Client Statement of Needs  pt "still have counseling for stress and things that come up- continue to adjust to changes in past year and keep anxiety and sadness down"  Treatment Level  outpatient counseling  Symptoms  Excessive and/or unrealistic worry that is difficult to control: No Description Entered (Status: maintained). Feelings of depression and anxiety related to complaints of job dissatisfaction or the stress of employment responsibilities.: No Description Entered (Status: maintained). Lack of energy.: No Description Entered (Status: maintained). Poor concentration and indecisiveness.: No Description Entered (Status: maintained).  Problems Addressed  Unipolar Depression, Anxiety, Vocational Stress  Goals 1. Develop healthy thinking patterns and beliefs about self, others, and the world that lead to the alleviation and help prevent the relapse of depression. Objective Identify and replace thoughts and beliefs that support depression. Target Date: 2022-07-13 Frequency: Daily  Progress: 70 Modality: individual  Related Interventions Conduct Cognitive-Behavioral Therapy (see Cognitive Behavior Therapy by Olevia Bowens; Overcoming Depression by Lynita Lombard al.), beginning with helping the client learn the connection among cognition, depressive feelings, and actions. 2. Enhance ability to effectively cope with the full variety of life's worries and anxieties. Objective Learn and implement calming skills to reduce overall anxiety and manage anxiety symptoms. Target Date: 2022-07-13 Frequency:  Daily  Progress: 70 Modality: individual   Related Interventions Teach the client calming/relaxation skills (e.g., applied relaxation, progressive muscle relaxation, cue controlled relaxation; mindful breathing; biofeedback) and how to discriminate better between relaxation and tension; teach the client how to apply these skills to his/her daily life (e.g., New Directions in Progressive Muscle Relaxation by Casper Harrison, and Hazlett-Stevens; Treating Generalized Anxiety Disorder by Rygh and Amparo Bristol). Objective Identify, challenge, and replace biased, fearful self-talk with positive, realistic, and empowering self-talk. Target Date: 2022-07-13 Frequency: Daily  Progress: 70 Modality: individual  Related Interventions Explore the client's schema and self-talk that mediate his/her fear response; assist him/her in challenging the biases; replace the distorted messages with reality-based alternatives and positive, realistic self-talk that will increase his/her self-confidence in coping with irrational fears (see Cognitive Therapy of Anxiety Disorders by Alison Stalling). 3. Pursue employment consistency with a values for work life balance Objective Identify and replace distorted cognitive messages associated with feelings of job stress.  Target Date: 2022-07-13 Frequency: Daily  Progress: 0 Modality: individual  Related Interventions Probe and clarify the client's emotions surrounding his/her vocational stress. Pt participated in development of plan and provided verbal consent.  Jan Fireman, [facility]

## 2021-09-14 ENCOUNTER — Other Ambulatory Visit: Payer: Self-pay

## 2021-09-14 ENCOUNTER — Ambulatory Visit: Payer: No Typology Code available for payment source | Admitting: Family Medicine

## 2021-09-14 ENCOUNTER — Encounter: Payer: Self-pay | Admitting: Family Medicine

## 2021-09-14 DIAGNOSIS — L0291 Cutaneous abscess, unspecified: Secondary | ICD-10-CM | POA: Diagnosis not present

## 2021-09-14 MED ORDER — CEPHALEXIN 500 MG PO CAPS: 500.0000 mg | ORAL_CAPSULE | Freq: Two times a day (BID) | ORAL | 0 refills | Status: DC

## 2021-09-14 NOTE — Patient Instructions (Signed)
You have an infected cyst  Keep very clean with soap and water  Avoid adhesive if you can- try to tuck gauze in your bra instead Warm compresses are ok   Take the keflex 500 mg three times daily for a week   If worse or more painful or swelling let us know (or fever or any other new new symptoms)    It may drain more and that is fine

## 2021-09-14 NOTE — Progress Notes (Signed)
Subjective:    Patient ID: Danielle Perry, female    DOB: 1996-02-02, 26 y.o.   MRN: [id number]  This visit occurred during the SARS-CoV-2 public health emergency.  Safety protocols were in place, including screening questions prior to the visit, additional usage of staff PPE, and extensive cleaning of exam room while observing appropriate contact time as indicated for disinfecting solutions.   HPI Pt presents for skin problem under L breast   Wt Readings from Last 3 Encounters:  09/14/21 161 lb 8 oz (73.3 kg)  12/16/20 157 lb 9 oz (71.5 kg)  08/13/20 145 lb 2 oz (65.8 kg)   29.78 kg/m  Spot under R breast  Has had it for a long time  Had a hair in it, she plucked it out  Then it became inflamed- drained some white stuff and it drained   It hurt last week/hurt nipple area  Today it is a little less   No fever Feels ok   Patient Active Problem List   Diagnosis Date Noted   Abscess 09/14/2021   Stress reaction 06/20/2020   Encounter for gynecological examination (general) (routine) without abnormal findings 11/15/2018   Routine general medical examination at a health care facility 02/15/2017   Screen for STD (sexually transmitted disease) 02/15/2017   Dysmenorrhea 10/19/2013   Acne 10/19/2013   Well adolescent visit 10/13/2012   No past medical history on file. No past surgical history on file. Social History   Tobacco Use   Smoking status: Never   Smokeless tobacco: Never  Substance Use Topics   Alcohol use: Yes    Comment: occ   Drug use: No   Family History  Problem Relation Age of Onset   Diabetes Mellitus I Paternal Grandfather    No Active Allergies Current Outpatient Medications on File Prior to Visit  Medication Sig Dispense Refill   norgestimate-ethinyl estradiol (SPRINTEC 28) 0.25-35 MG-MCG tablet Take 1 tablet by mouth daily. 84 tablet 3   sertraline (ZOLOFT) 50 MG tablet TAKE 1 TABLET BY MOUTH EVERY DAY 90 tablet 1   No current  facility-administered medications on file prior to visit.    Review of Systems  Constitutional:  Negative for activity change, appetite change, fatigue, fever and unexpected weight change.  HENT:  Negative for congestion, ear pain, rhinorrhea, sinus pressure and sore throat.   Eyes:  Negative for pain, redness and visual disturbance.  Respiratory:  Negative for cough, shortness of breath and wheezing.   Cardiovascular:  Negative for chest pain and palpitations.  Gastrointestinal:  Negative for abdominal pain, blood in stool, constipation and diarrhea.  Endocrine: Negative for polydipsia and polyuria.  Genitourinary:  Negative for dysuria, frequency and urgency.  Musculoskeletal:  Negative for arthralgias, back pain and myalgias.  Skin:  Positive for color change and wound. Negative for pallor and rash.  Allergic/Immunologic: Negative for environmental allergies.  Neurological:  Negative for dizziness, syncope and headaches.  Hematological:  Negative for adenopathy. Does not bruise/bleed easily.  Psychiatric/Behavioral:  Negative for decreased concentration and dysphoric mood. The patient is not nervous/anxious.       Objective:   Physical Exam Constitutional:      General: She is not in acute distress.    Appearance: Normal appearance. She is normal weight. She is not ill-appearing or diaphoretic.  Eyes:     General:        Right eye: No discharge.        Left eye: No discharge.  Conjunctiva/sclera: Conjunctivae normal.     Pupils: Pupils are equal, round, and reactive to light.  Cardiovascular:     Rate and Rhythm: Tachycardia present.  Pulmonary:     Effort: Pulmonary effort is normal. No respiratory distress.     Breath sounds: Normal breath sounds.  Musculoskeletal:     Cervical back: Normal range of motion and neck supple. No tenderness.  Lymphadenopathy:     Cervical: No cervical adenopathy.  Skin:    General: Skin is warm and dry.     Findings: Erythema present.      Comments: 3 by 4 cm oval area of induration and erythema under R breast  More firm than fluctuant Central hole draining clear/pink fluid-able to express Moderately tender  Some irritation from band aid adhesive No streaking   Neurological:     Mental Status: She is alert.  Psychiatric:        Mood and Affect: Mood normal.          Assessment & Plan:   Problem List Items Addressed This Visit       Other   Abscess    Skin abscess under L breast-is actively draining w/o streaking  Px keflex 500 mg tid for 7d  No prior h/o MRSA- but consider adding tx if no improvement  inst to clean with soap and water, gently enc drainage and use clean gauze in bra  ER precautions disc Watch for inc swelling/pain /redness or any streaks  Update if not starting to improve in a week or if worsening

## 2021-09-14 NOTE — Assessment & Plan Note (Signed)
Skin abscess under L breast-is actively draining w/o streaking  Px keflex 500 mg tid for 7d  No prior h/o MRSA- but consider adding tx if no improvement  inst to clean with soap and water, gently enc drainage and use clean gauze in bra  ER precautions disc Watch for inc swelling/pain /redness or any streaks  Update if not starting to improve in a week or if worsening

## 2021-10-29 ENCOUNTER — Ambulatory Visit (INDEPENDENT_AMBULATORY_CARE_PROVIDER_SITE_OTHER): Payer: No Typology Code available for payment source | Admitting: Psychology

## 2021-10-29 DIAGNOSIS — F4323 Adjustment disorder with mixed anxiety and depressed mood: Secondary | ICD-10-CM

## 2021-10-29 NOTE — Progress Notes (Signed)
[facility] Counselor/Therapist Progress Note  Patient ID: Danielle Perry, MRN: [id number],    Date: 10/29/2021  Time Spent: 12:00pm-12:58pm     Treatment Type: Individual Therapy  Pt is seen for a virtual video visit via webex.  Pt joined from her work [employer] from her work office.   Reported Symptoms: stress, anxiety, negative interaction w mom  Mental Status Exam: Appearance:  Well Groomed     Behavior: Appropriate  Motor: Normal  Speech/Language:  Normal Rate  Affect: Appropriate  Mood: normal  Thought process: normal  Thought content:   WNL  Sensory/Perceptual disturbances:   WNL  Orientation: oriented to person, place, time/date, and situation  Attention: Good  Concentration: Good  Memory: WNL  Fund of knowledge:  Good  Insight:   Good  Judgment:  Good  Impulse Control: Good   Risk Assessment: Danger to Self:  No Self-injurious Behavior: No Danger to Others: No Duty to Warn:no Physical Aggression / Violence:No  Access to Firearms a concern: No  Gang Involvement:No   Subjective: Counselor assessed pt current functioning per pt report.  Processed w/pt stressors and positives.  Explored w/ pt conflict w/ mom and negative self talk following.  Discussed patterns of interactions w/ mom, not responsible for mom's escalations or parent's marriage.   Explored assertive communication and reframing negative self talk.  Pt affect wnl.  Pt reported that work is going well, making less errors.  Pt reported on conflict w/mom re: decision for laser hair removal and how mom escalated and putting down for decision.  Pt discussed how she responded w/ escalating herself- statements that she is a failure and causing conflict for parents.  Pt reported dad was able to talk w her and acknowledge her wants and how mom not approaching well.  Pt reported that she was able to reflect on things and acknowledge steps she is doing to take towards further independence.  Pt  is doing well w/ saving money and preparing to move out.  Pt identified pattern of negative interactions.  Pt discussed how she was able to assert w/ mom the following conflict and that need to work towards changing pattern.      Interventions: Cognitive Behavioral Therapy and Positive self talk  Diagnosis:Adjustment disorder with mixed anxiety and depressed mood  Plan: Pt to f/u next month for counseling to assist coping w/ stressors.  Pt to f/u as scheduled w/ PCP.    Treatment Plan 07/13/21 Client Abilities/Strengths  supports- boyfriend, couple close friends enjoys drawing, reading, crocheting. 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 continue med management w/ PCP  Client Statement of Needs  pt "still have counseling for stress and things that come up- continue to adjust to changes in past year and keep anxiety and sadness down"  Treatment Level  outpatient counseling  Symptoms  Excessive and/or unrealistic worry that is difficult to control: No Description Entered (Status: maintained). Feelings of depression and anxiety related to complaints of job dissatisfaction or the stress of employment responsibilities.: No Description Entered (Status: maintained). Lack of energy.: No Description Entered (Status: maintained). Poor concentration and indecisiveness.: No Description Entered (Status: maintained).  Problems Addressed  Unipolar Depression, Anxiety, Vocational Stress  Goals 1. Develop healthy thinking patterns and beliefs about self, others, and the world that lead to the alleviation and help prevent the relapse of depression. Objective Identify and replace thoughts and beliefs that support depression. Target Date: 2022-07-13  Frequency: Daily  Progress: 70 Modality: individual  Related Interventions Conduct Cognitive-Behavioral Therapy (see Cognitive Behavior Therapy by Reola Calkins; Overcoming Depression by Agapito Games  al.), beginning with helping the client learn the connection among cognition, depressive feelings, and actions. 2. Enhance ability to effectively cope with the full variety of life's worries and anxieties. Objective Learn and implement calming skills to reduce overall anxiety and manage anxiety symptoms. Target Date: 2022-07-13 Frequency: Daily  Progress: 70 Modality: individual  Related Interventions Teach the client calming/relaxation skills (e.g., applied relaxation, progressive muscle relaxation, cue controlled relaxation; mindful breathing; biofeedback) and how to discriminate better between relaxation and tension; teach the client how to apply these skills to his/her daily life (e.g., New Directions in Progressive Muscle Relaxation by Marcelyn Ditty, and Hazlett-Stevens; Treating Generalized Anxiety Disorder by Rygh and Ida Rogue). Objective Identify, challenge, and replace biased, fearful self-talk with positive, realistic, and empowering self-talk. Target Date: 2022-07-13 Frequency: Daily  Progress: 70 Modality: individual  Related Interventions Explore the client's schema and self-talk that mediate his/her fear response; assist him/her in challenging the biases; replace the distorted messages with reality-based alternatives and positive, realistic self-talk that will increase his/her self-confidence in coping with irrational fears (see Cognitive Therapy of Anxiety Disorders by Laurence Slate). 3. Pursue employment consistency with a values for work life balance Objective Identify and replace distorted cognitive messages associated with feelings of job stress.  Target Date: 2022-07-13 Frequency: Daily  Progress: 0 Modality: individual  Related Interventions Probe and clarify the client's emotions surrounding his/her vocational stress. Pt participated in development of plan and provided verbal consent.  Forde Radon [facility]                  [place], [facility]

## 2021-12-01 ENCOUNTER — Ambulatory Visit (INDEPENDENT_AMBULATORY_CARE_PROVIDER_SITE_OTHER): Payer: No Typology Code available for payment source | Admitting: Psychology

## 2021-12-01 DIAGNOSIS — F4323 Adjustment disorder with mixed anxiety and depressed mood: Secondary | ICD-10-CM | POA: Diagnosis not present

## 2021-12-01 NOTE — Progress Notes (Signed)
[facility] Counselor/Therapist Progress Note ? ?Patient ID: Danielle Perry, MRN: [id number],   ? ?Date: 12/01/2021 ? ?Time Spent: 12:02pm-12:50pm    ? ?Treatment Type: Individual Therapy  Pt is seen for a virtual video visit via webex.  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  ? ?Reported Symptoms: some recent sadness, improved interactions w/ mom, improved self talk ?Mental Status Exam: ?Appearance:  Well Groomed     ?Behavior: Appropriate  ?Motor: Normal  ?Speech/Language:  Normal Rate  ?Affect: Appropriate  ?Mood: sad  ?Thought process: normal  ?Thought content:   WNL  ?Sensory/Perceptual disturbances:   WNL  ?Orientation: oriented to person, place, time/date, and situation  ?Attention: Good  ?Concentration: Good  ?Memory: WNL  ?Fund of knowledge:  Good  ?Insight:   Good  ?Judgment:  Good  ?Impulse Control: Good  ? ?Risk Assessment: ?Danger to Self:  No ?Self-injurious Behavior: No ?Danger to Others: No ?Duty to Warn:no ?Physical Aggression / Violence:No  ?Access to Firearms a concern: No  ?Gang Involvement:No  ? ?Subjective: Counselor assessed pt current functioning per pt report.  Processed w/pt stressors and positives.  Explored w/ pt interactions w/ mom and asserting self.  Explored w/pt work stressors and reframing negative self talk.  Discussed some increase sadness w/ loss of neighbor dog.  Explored ways to acknowledge emotions and engage for coping.  Pt affect congruent w/ report of some recent sadness.  Pt reported neighbor who was close to died- this makes 3 dogs from her childhood in past 1.5 years.  Pt reported interactions w/ mom have been ok.  Pt reported that asserting self.  Pt reported to looking forward to trip to grandparents, vacation time this summer.  pt reflected on progress making at work and not putting self down for mistakes.  ?Interventions: Cognitive Behavioral Therapy and Positive self talk ? ?Diagnosis:Adjustment disorder with mixed anxiety  and depressed mood ? ?Plan: Pt to f/u in one month for counseling to assist coping w/ stressors.  Pt to f/u as scheduled w/ PCP.   ? ?Treatment Plan 07/13/21 ?Client Abilities/Strengths  ?supports- boyfriend, couple close friends enjoys drawing, reading, crocheting. 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 continue med management w/ PCP  ?Client Statement of Needs  ?pt "still have counseling for stress and things that come up- continue to adjust to changes in past year and keep anxiety and sadness down"  ?Treatment Level  ?outpatient counseling  ?Symptoms  ?Excessive and/or unrealistic worry that is difficult to control: No Description Entered (Status: maintained). Feelings of depression and anxiety related to complaints of job dissatisfaction or the stress of employment responsibilities.: No Description Entered (Status: maintained). Lack of energy.: No Description Entered (Status: maintained). Poor concentration and indecisiveness.: No Description Entered (Status: maintained).  ?Problems Addressed  ?Unipolar Depression, Anxiety, Vocational Stress  ?Goals ?1. Develop healthy thinking patterns and beliefs about self, others, and the world that lead to the alleviation and help prevent the relapse of depression. ?Objective ?Identify and replace thoughts and beliefs that support depression. ?Target Date: 2022-07-13 Frequency: Daily  ?Progress: 70 Modality: individual  ?Related Interventions ?Conduct Cognitive-Behavioral Therapy (see Cognitive Behavior Therapy by Reola Calkins; Overcoming Depression by Agapito Games al.), beginning with helping the client learn the connection among cognition, depressive feelings, and actions. ?2. Enhance ability to effectively cope with the full variety of life's worries and anxieties. ?Objective ?Learn and implement calming skills to reduce overall  anxiety and manage anxiety symptoms. ?Target Date: 2022-07-13 Frequency: Daily   ?Progress: 70 Modality: individual  ?Related Interventions ?Teach the client calming/relaxation skills (e.g., applied relaxation, progressive muscle relaxation, cue controlled relaxation; mindful breathing; biofeedback) and how to discriminate better between relaxation and tension; teach the client how to apply these skills to his/her daily life (e.g., New Directions in Progressive Muscle Relaxation by Marcelyn Ditty, and Hazlett-Stevens; Treating Generalized Anxiety Disorder by Rygh and Ida Rogue). ?Objective ?Identify, challenge, and replace biased, fearful self-talk with positive, realistic, and empowering self-talk. ?Target Date: 2022-07-13 Frequency: Daily  ?Progress: 70 Modality: individual  ?Related Interventions ?Explore the client's schema and self-talk that mediate his/her fear response; assist him/her in challenging the biases; replace the distorted messages with reality-based alternatives and positive, realistic self-talk that will increase his/her self-confidence in coping with irrational fears (see Cognitive Therapy of Anxiety Disorders by Laurence Slate). ?3. Pursue employment consistency with a values for work life balance ?Objective ?Identify and replace distorted cognitive messages associated with feelings of job stress.  ?Target Date: 2022-07-13 Frequency: Daily  ?Progress: 0 Modality: individual  ?Related Interventions ?Probe and clarify the client's emotions surrounding his/her vocational stress. ?Pt participated in development of plan and provided verbal consent. ? ? ? Forde Radon, [facility] ?

## 2021-12-18 ENCOUNTER — Encounter: Payer: BC Managed Care – PPO | Admitting: Family Medicine

## 2021-12-21 ENCOUNTER — Other Ambulatory Visit: Payer: Self-pay | Admitting: Family Medicine

## 2021-12-25 ENCOUNTER — Ambulatory Visit (INDEPENDENT_AMBULATORY_CARE_PROVIDER_SITE_OTHER): Payer: No Typology Code available for payment source | Admitting: Family Medicine

## 2021-12-25 ENCOUNTER — Encounter: Payer: Self-pay | Admitting: Family Medicine

## 2021-12-25 VITALS — BP 126/94 | HR 103 | Temp 98.2°F | Ht 61.0 in | Wt 164.2 lb

## 2021-12-25 DIAGNOSIS — N946 Dysmenorrhea, unspecified: Secondary | ICD-10-CM

## 2021-12-25 DIAGNOSIS — Z0000 Encounter for general adult medical examination without abnormal findings: Secondary | ICD-10-CM

## 2021-12-25 DIAGNOSIS — F4323 Adjustment disorder with mixed anxiety and depressed mood: Secondary | ICD-10-CM | POA: Diagnosis not present

## 2021-12-25 MED ORDER — NORGESTIMATE-ETH ESTRADIOL 0.25-35 MG-MCG PO TABS: 1.0000 | ORAL_TABLET | Freq: Every day | ORAL | 3 refills | Status: DC

## 2021-12-25 NOTE — Progress Notes (Signed)
? ?Subjective:  ? ? Patient ID: Danielle Perry, female    DOB: Apr 29, 1996, 26 y.o.   MRN: [id number] ? ?HPI ?Here for health maintenance exam and to review chronic medical problems   ? ?Wt Readings from Last 3 Encounters:  ?12/25/21 164 lb 3.2 oz (74.5 kg)  ?09/14/21 161 lb 8 oz (73.3 kg)  ?12/16/20 157 lb 9 oz (71.5 kg)  ? ?31.03 kg/m? ? ?Doing ok  ?Working - in [employer], Financial controller for [employer] ?Really loves her job  ? ?Feeling pretty good  ?Taking care of herself  ?Does a spa day at home once a week ? ?Not a lot of exercise , does u tube video or hour long walk  ?Is hard to fit in with the commute  ?Some steps [place]  ? ? ? ?GF had MI and other one had a hip fracture  ? ? ?Immunization History  ?Administered Date(s) Administered  ? DTaP 11/07/1996, 01/08/1997, 03/12/1997, 04/07/1998, 11/14/2000  ? HPV 9-valent 11/15/2018, 01/17/2019, 05/22/2019  ? Hepatitis B March 29, 1996, 10/10/1996, 06/12/1997  ? HiB (PRP-OMP) 11/07/1996, 01/08/1997, 03/12/1997, 09/20/1997  ? IPV 11/07/1996, 01/08/1997, 03/12/1997, 11/14/2000  ? MMR 09/09/1997, 11/14/2000  ? Meningococcal Conjugate 10/19/2013, 02/15/2017  ? PFIZER(Purple Top)SARS-COV-2 Vaccination 02/02/2020, 02/23/2020  ? Tdap 01/09/2009, 12/11/2019  ? Varicella 09/09/1997  ? ?Pap 11/2009 neg  ?On menses -wants to do next time  ? ? ?Menses: not bad/ regular , not heavy or painful last 5 days ?Occ migraine  ?On ortho cyclen - no problems with it  ?No new partners  ? ?Self breast exam : no lumps  ? ?Last cpe STD tests were negative  ?Declines screening today  ? ? ?BP Readings from Last 3 Encounters:  ?12/25/21 (!) 126/94  ?09/14/21 116/70  ?12/16/20 110/68  ? ?Pulse Readings from Last 3 Encounters:  ?12/25/21 (!) 103  ?09/14/21 93  ?12/16/20 84  ? ? ? ?Takex zoloft 50 mg daily for mood /adj disorder ?Has had counseling  ?Doing better  ?More energy in general  ?Does not cry as much  ?Dealing with stress well  ? ?Would like to wean off of it eventually /soon  ? ?Sees counselor  once per month- doing very well  ? ?Some menstrual mood changes  ? ?Patient Active Problem List  ? Diagnosis Date Noted  ? Adjustment disorder 06/20/2020  ? Encounter for gynecological examination (general) (routine) without abnormal findings 11/15/2018  ? Routine general medical examination at a health care facility 02/15/2017  ? Screen for STD (sexually transmitted disease) 02/15/2017  ? Dysmenorrhea 10/19/2013  ? Acne 10/19/2013  ? ?No past medical history on file. ?No past surgical history on file. ?Social History  ? ?Tobacco Use  ? Smoking status: Never  ? Smokeless tobacco: Never  ?Substance Use Topics  ? Alcohol use: Yes  ?  Comment: occ  ? Drug use: No  ? ?Family History  ?Problem Relation Age of Onset  ? Diabetes Mellitus I Paternal Grandfather   ? ?No Active Allergies ?Current Outpatient Medications on File Prior to Visit  ?Medication Sig Dispense Refill  ? sertraline (ZOLOFT) 50 MG tablet TAKE 1 TABLET BY MOUTH EVERY DAY 90 tablet 1  ? ?No current facility-administered medications on file prior to visit.  ?  ?Review of Systems  ?Constitutional:  Negative for activity change, appetite change, fatigue, fever and unexpected weight change.  ?HENT:  Negative for congestion, ear pain, rhinorrhea, sinus pressure and sore throat.   ?Eyes:  Negative for pain, redness  and visual disturbance.  ?Respiratory:  Negative for cough, shortness of breath and wheezing.   ?Cardiovascular:  Negative for chest pain and palpitations.  ?Gastrointestinal:  Negative for abdominal pain, blood in stool, constipation and diarrhea.  ?Endocrine: Negative for polydipsia and polyuria.  ?Genitourinary:  Negative for dysuria, frequency and urgency.  ?Musculoskeletal:  Negative for arthralgias, back pain and myalgias.  ?Skin:  Negative for pallor and rash.  ?Allergic/Immunologic: Negative for environmental allergies.  ?Neurological:  Negative for dizziness, syncope and headaches.  ?Hematological:  Negative for adenopathy. Does not  bruise/bleed easily.  ?Psychiatric/Behavioral:  Negative for decreased concentration and dysphoric mood. The patient is not nervous/anxious.   ?     Mood is much better  ?Some ups/downs around menses  ? ?   ?Objective:  ? Physical Exam ?Constitutional:   ?   General: She is not in acute distress. ?   Appearance: Normal appearance. She is well-developed. She is obese. She is not ill-appearing or diaphoretic.  ?HENT:  ?   Head: Normocephalic and atraumatic.  ?   Right Ear: Tympanic membrane, ear canal and external ear normal.  ?   Left Ear: Tympanic membrane, ear canal and external ear normal.  ?   Nose: Nose normal. No congestion.  ?   Mouth/Throat:  ?   Mouth: Mucous membranes are moist.  ?   Pharynx: Oropharynx is clear. No posterior oropharyngeal erythema.  ?Eyes:  ?   General: No scleral icterus. ?   Extraocular Movements: Extraocular movements intact.  ?   Conjunctiva/sclera: Conjunctivae normal.  ?   Pupils: Pupils are equal, round, and reactive to light.  ?Neck:  ?   Thyroid: No thyromegaly.  ?   Vascular: No carotid bruit or JVD.  ?Cardiovascular:  ?   Rate and Rhythm: Normal rate and regular rhythm.  ?   Pulses: Normal pulses.  ?   Heart sounds: Normal heart sounds.  ?  No gallop.  ?Pulmonary:  ?   Effort: Pulmonary effort is normal. No respiratory distress.  ?   Breath sounds: Normal breath sounds. No wheezing.  ?   Comments: Good air exch ?Chest:  ?   Chest wall: No tenderness.  ?Abdominal:  ?   General: Bowel sounds are normal. There is no distension or abdominal bruit.  ?   Palpations: Abdomen is soft. There is no mass.  ?   Tenderness: There is no abdominal tenderness.  ?   Hernia: No hernia is present.  ?Genitourinary: ?   Comments: Breast exam: No mass, nodules, thickening, tenderness, bulging, retraction, inflamation, nipple discharge or skin changes noted.  No axillary or clavicular LA.     ?Musculoskeletal:     ?   General: No tenderness. Normal range of motion.  ?   Cervical back: Normal range of  motion and neck supple. No rigidity. No muscular tenderness.  ?   Right lower leg: No edema.  ?   Left lower leg: No edema.  ?   Comments: No kyphosis   ?Lymphadenopathy:  ?   Cervical: No cervical adenopathy.  ?Skin: ?   General: Skin is warm and dry.  ?   Coloration: Skin is not pale.  ?   Findings: No erythema or rash.  ?   Comments: Facial acne with some scabs/chin and cheeks ?Fair complexion   ?Neurological:  ?   Mental Status: She is alert. Mental status is at baseline.  ?   Cranial Nerves: No cranial nerve deficit.  ?  Motor: No abnormal muscle tone.  ?   Coordination: Coordination normal.  ?   Gait: Gait normal.  ?   Deep Tendon Reflexes: Reflexes are normal and symmetric. Reflexes normal.  ?Psychiatric:     ?   Mood and Affect: Mood normal.     ?   Cognition and Memory: Cognition and memory normal.  ?   Comments: Mood is good  ?Talkative  ?Pleasant   ? ? ? ? ? ?   ?Assessment & Plan:  ? ?Problem List Items Addressed This Visit   ? ?  ? Genitourinary  ? Dysmenorrhea  ?  Well controlled with ortho cyclen ? ?  ?  ?  ? Other  ? Adjustment disorder  ?  Mood is improved  ?Continues counseling  ?Stress is less/ wants to start weaning sertraline  ?Will cut by 1/2 then 1/2 every other day and then stop  ?Update if problems/disc expectations ?Enc good self care incl exercise  ? ?  ?  ? Routine general medical examination at a health care facility - Primary  ?  Reviewed health habits including diet and exercise and skin cancer prevention ?Reviewed appropriate screening tests for age  ?Also reviewed health mt list, fam hx and immunization status , as well as social and family history   ?See HPI ?Labs rev from a year ago-nl  ?Declines flu shots  ?utd with pap/disc self breast exams  ?Declines STD screen/no need  ? ?  ?  ? ? ?

## 2021-12-25 NOTE — Patient Instructions (Addendum)
To wean zoloft ?Take 1/2 pill daily for 2 weeks ?If doing well the 1/2 pill every other day for 1-2 weeks and then stop  ? ?Continue counseling  ? ?Try to fit in movement/exercise when you can  ?Yoga is great!  ? ?Take care of yourself  ? ? ?

## 2021-12-25 NOTE — Assessment & Plan Note (Signed)
Mood is improved  ?Continues counseling  ?Stress is less/ wants to start weaning sertraline  ?Will cut by 1/2 then 1/2 every other day and then stop  ?Update if problems/disc expectations ?Enc good self care incl exercise  ?

## 2021-12-25 NOTE — Assessment & Plan Note (Signed)
Well controlled with ortho cyclen ?

## 2021-12-25 NOTE — Assessment & Plan Note (Signed)
Reviewed health habits including diet and exercise and skin cancer prevention ?Reviewed appropriate screening tests for age  ?Also reviewed health mt list, fam hx and immunization status , as well as social and family history   ?See HPI ?Labs rev from a year ago-nl  ?Declines flu shots  ?utd with pap/disc self breast exams  ?Declines STD screen/no need  ?

## 2022-01-07 ENCOUNTER — Encounter: Payer: Self-pay | Admitting: Family Medicine

## 2022-01-08 ENCOUNTER — Telehealth: Payer: Self-pay | Admitting: *Deleted

## 2022-01-08 NOTE — Telephone Encounter (Signed)
Sent mychart letting pt know Dr. Tower's comments. 

## 2022-01-08 NOTE — Telephone Encounter (Signed)
If stress is that high and there are job performance issues I recommend you go back to the usual dose and try again at a later time when stress is less ?

## 2022-01-08 NOTE — Telephone Encounter (Signed)
Pt sent a message saying: ? ?So I tried taking the half of the pill each day for almost 2 weeks and I don?t think it?s helping.  ?  ?I feel a tingling feeling in my chest today. I believe due to stress at work. I?m on a 30 day watch due to my performance. Please let me know if you know some options  ? ?

## 2022-01-12 ENCOUNTER — Ambulatory Visit: Payer: BC Managed Care – PPO | Admitting: Psychology

## 2022-01-21 ENCOUNTER — Ambulatory Visit (INDEPENDENT_AMBULATORY_CARE_PROVIDER_SITE_OTHER): Payer: No Typology Code available for payment source | Admitting: Psychology

## 2022-01-21 DIAGNOSIS — F4323 Adjustment disorder with mixed anxiety and depressed mood: Secondary | ICD-10-CM | POA: Diagnosis not present

## 2022-01-21 NOTE — Progress Notes (Signed)
[facility] Counselor/Therapist Progress Note  Patient ID: Danielle Perry, MRN: [id number],    Date: 01/21/2022  Time Spent: 12:00pm-12:45pm     Treatment Type: Individual Therapy  Pt is seen for a virtual video visit via webex.  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   Reported Symptoms: worry about job and stress on job w/ corrective plan Mental Status Exam: Appearance:  Well Groomed     Behavior: Appropriate  Motor: Normal  Speech/Language:  Normal Rate  Affect: Appropriate  Mood: sad  Thought process: normal  Thought content:   WNL  Sensory/Perceptual disturbances:   WNL  Orientation: oriented to person, place, time/date, and situation  Attention: Good  Concentration: Good  Memory: WNL  Fund of knowledge:  Good  Insight:   Good  Judgment:  Good  Impulse Control: Good   Risk Assessment: Danger to Self:  No Self-injurious Behavior: No Danger to Others: No Duty to Warn:no Physical Aggression / Violence:No  Access to Firearms a concern: No  Gang Involvement:No   Subjective: Counselor assessed pt current functioning per pt report.  Processed w/pt stressors and positives.  Explored w/ pt steps taking and support from trainer.  Discussed encouraging self statements.  Assisted pt in recognizing things that are going well and reframing negative self talk.  Pt affect wnl.  Pt reported she is currently on a 30 day review at work after errors on one account and complaint received.  Pt reported that she is feels good about how she is doing since and only minor mistakes.  Pt reports feels good support and feedback received.  Pt reported that she initially had negative self talk that everything was bad.  Pt reported on things that have been positive and encouraging- time w/ niece, leaving weekend job and things looking forward to w/ family and boyfriend and friends.   Interventions: Cognitive Behavioral Therapy and Positive self  talk  Diagnosis:Adjustment disorder with mixed anxiety and depressed mood  Plan: Pt to f/u in one month for counseling to assist coping w/ stressors.  Pt to f/u as scheduled w/ PCP.    Treatment Plan 07/13/21 Client Abilities/Strengths  supports- boyfriend, couple close friends enjoys drawing, reading, crocheting. 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 continue med management w/ PCP  Client Statement of Needs  pt "still have counseling for stress and things that come up- continue to adjust to changes in past year and keep anxiety and sadness down"  Treatment Level  outpatient counseling  Symptoms  Excessive and/or unrealistic worry that is difficult to control: No Description Entered (Status: maintained). Feelings of depression and anxiety related to complaints of job dissatisfaction or the stress of employment responsibilities.: No Description Entered (Status: maintained). Lack of energy.: No Description Entered (Status: maintained). Poor concentration and indecisiveness.: No Description Entered (Status: maintained).  Problems Addressed  Unipolar Depression, Anxiety, Vocational Stress  Goals 1. Develop healthy thinking patterns and beliefs about self, others, and the world that lead to the alleviation and help prevent the relapse of depression. Objective Identify and replace thoughts and beliefs that support depression. Target Date: 2022-07-13 Frequency: Daily  Progress: 70 Modality: individual  Related Interventions Conduct Cognitive-Behavioral Therapy (see Cognitive Behavior Therapy by Reola Calkins; Overcoming Depression by Agapito Games al.), beginning with helping the client learn the connection among cognition, depressive feelings, and actions. 2. Enhance ability to effectively cope with the full variety of life's worries and  anxieties. Objective Learn and implement calming skills to reduce overall anxiety and manage  anxiety symptoms. Target Date: 2022-07-13 Frequency: Daily  Progress: 70 Modality: individual  Related Interventions Teach the client calming/relaxation skills (e.g., applied relaxation, progressive muscle relaxation, cue controlled relaxation; mindful breathing; biofeedback) and how to discriminate better between relaxation and tension; teach the client how to apply these skills to his/her daily life (e.g., New Directions in Progressive Muscle Relaxation by Marcelyn Ditty, and Hazlett-Stevens; Treating Generalized Anxiety Disorder by Rygh and Ida Rogue). Objective Identify, challenge, and replace biased, fearful self-talk with positive, realistic, and empowering self-talk. Target Date: 2022-07-13 Frequency: Daily  Progress: 70 Modality: individual  Related Interventions Explore the client's schema and self-talk that mediate his/her fear response; assist him/her in challenging the biases; replace the distorted messages with reality-based alternatives and positive, realistic self-talk that will increase his/her self-confidence in coping with irrational fears (see Cognitive Therapy of Anxiety Disorders by Laurence Slate). 3. Pursue employment consistency with a values for work life balance Objective Identify and replace distorted cognitive messages associated with feelings of job stress.  Target Date: 2022-07-13 Frequency: Daily  Progress: 0 Modality: individual  Related Interventions Probe and clarify the client's emotions surrounding his/her vocational stress. Pt participated in development of plan and provided verbal consent.         Forde Radon, [facility]

## 2022-02-03 ENCOUNTER — Other Ambulatory Visit: Payer: Self-pay | Admitting: Family Medicine

## 2022-02-11 ENCOUNTER — Ambulatory Visit (INDEPENDENT_AMBULATORY_CARE_PROVIDER_SITE_OTHER): Payer: No Typology Code available for payment source | Admitting: Psychology

## 2022-02-11 DIAGNOSIS — F4323 Adjustment disorder with mixed anxiety and depressed mood: Secondary | ICD-10-CM

## 2022-02-11 NOTE — Progress Notes (Signed)
[facility] Counselor/Therapist Progress Note  Patient ID: Danielle Perry, MRN: [id number],    Date: 02/11/2022  Time Spent: 12:00pm-12:46pm     Treatment Type: Individual Therapy  Pt is seen for a virtual video visit via webex.  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   Reported Symptoms: decreased anxiety/worry, family stressors Mental Status Exam: Appearance:  Well Groomed     Behavior: Appropriate  Motor: Normal  Speech/Language:  Normal Rate  Affect: Appropriate  Mood: normal  Thought process: normal  Thought content:   WNL  Sensory/Perceptual disturbances:   WNL  Orientation: oriented to person, place, time/date, and situation  Attention: Good  Concentration: Good  Memory: WNL  Fund of knowledge:  Good  Insight:   Good  Judgment:  Good  Impulse Control: Good   Risk Assessment: Danger to Self:  No Self-injurious Behavior: No Danger to Others: No Duty to Warn:no Physical Aggression / Violence:No  Access to Firearms a concern: No  Gang Involvement:No   Subjective: Counselor assessed pt current functioning per pt report.  Processed w/pt stressors and positives.  Reflected improved mood and coping w/ stressors.  Explored family stressors and focus on what is in her control, communication and deescalating skills for self care.    Pt affect wnl.  Pt reported she work has been going better and meeting her goals at work.  Pt reported that she has been busy w/ attending weddings.  Pt reported household busy w/ both sisters and niece living there.  Pt reported family stressors w/ her grandparents health.  Pt discussed mom irritability and learning what is in her control and when needs to take time for self.   Pt discussed looking forward to vacation this summer.    Interventions: Cognitive Behavioral Therapy and Assertiveness/Communication  Diagnosis:Adjustment disorder with mixed anxiety and depressed mood  Plan: Pt to f/u in one month  for counseling to assist coping w/ stressors.  Pt to f/u as scheduled w/ PCP.    Treatment Plan 07/13/21 Client Abilities/Strengths  supports- boyfriend, couple close friends enjoys drawing, reading, crocheting. 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 continue med management w/ PCP  Client Statement of Needs  pt "still have counseling for stress and things that come up- continue to adjust to changes in past year and keep anxiety and sadness down"  Treatment Level  outpatient counseling  Symptoms  Excessive and/or unrealistic worry that is difficult to control: No Description Entered (Status: maintained). Feelings of depression and anxiety related to complaints of job dissatisfaction or the stress of employment responsibilities.: No Description Entered (Status: maintained). Lack of energy.: No Description Entered (Status: maintained). Poor concentration and indecisiveness.: No Description Entered (Status: maintained).  Problems Addressed  Unipolar Depression, Anxiety, Vocational Stress  Goals 1. Develop healthy thinking patterns and beliefs about self, others, and the world that lead to the alleviation and help prevent the relapse of depression. Objective Identify and replace thoughts and beliefs that support depression. Target Date: 2022-07-13 Frequency: Daily  Progress: 70 Modality: individual  Related Interventions Conduct Cognitive-Behavioral Therapy (see Cognitive Behavior Therapy by Reola Calkins; Overcoming Depression by Agapito Games al.), beginning with helping the client learn the connection among cognition, depressive feelings, and actions. 2. Enhance ability to effectively cope with the full variety of life's worries and anxieties. Objective Learn and implement calming skills to reduce overall anxiety and manage anxiety symptoms. Target Date: 2022-07-13 Frequency:  Daily  Progress: 70 Modality: individual  Related  Interventions Teach the client calming/relaxation skills (e.g., applied relaxation, progressive muscle relaxation, cue controlled relaxation; mindful breathing; biofeedback) and how to discriminate better between relaxation and tension; teach the client how to apply these skills to his/her daily life (e.g., New Directions in Progressive Muscle Relaxation by Marcelyn Ditty, and Hazlett-Stevens; Treating Generalized Anxiety Disorder by Rygh and Ida Rogue). Objective Identify, challenge, and replace biased, fearful self-talk with positive, realistic, and empowering self-talk. Target Date: 2022-07-13 Frequency: Daily  Progress: 70 Modality: individual  Related Interventions Explore the client's schema and self-talk that mediate his/her fear response; assist him/her in challenging the biases; replace the distorted messages with reality-based alternatives and positive, realistic self-talk that will increase his/her self-confidence in coping with irrational fears (see Cognitive Therapy of Anxiety Disorders by Laurence Slate). 3. Pursue employment consistency with a values for work life balance Objective Identify and replace distorted cognitive messages associated with feelings of job stress.  Target Date: 2022-07-13 Frequency: Daily  Progress: 0 Modality: individual  Related Interventions Probe and clarify the client's emotions surrounding his/her vocational stress. Pt participated in development of plan and provided verbal consent.       Forde Radon, [facility]

## 2022-03-25 ENCOUNTER — Ambulatory Visit (INDEPENDENT_AMBULATORY_CARE_PROVIDER_SITE_OTHER): Payer: No Typology Code available for payment source | Admitting: Psychology

## 2022-03-25 DIAGNOSIS — F4323 Adjustment disorder with mixed anxiety and depressed mood: Secondary | ICD-10-CM | POA: Diagnosis not present

## 2022-03-25 NOTE — Progress Notes (Signed)
[facility] Counselor/Therapist Progress Note  Patient ID: Danielle Perry, MRN: [id number],    Date: 03/25/2022  Time Spent: 12:01pm-12:47pm     Treatment Type: Individual Therapy  Pt is seen for a virtual video visit via caregility.  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   Reported Symptoms: mood good- continued decreased anxiety, increased self confidence w/ work, some conflict w/ sister. Mental Status Exam: Appearance:  Well Groomed     Behavior: Appropriate  Motor: Normal  Speech/Language:  Normal Rate  Affect: Appropriate  Mood: normal  Thought process: normal  Thought content:   WNL  Sensory/Perceptual disturbances:   WNL  Orientation: oriented to person, place, time/date, and situation  Attention: Good  Concentration: Good  Memory: WNL  Fund of knowledge:  Good  Insight:   Good  Judgment:  Good  Impulse Control: Good   Risk Assessment: Danger to Self:  No Self-injurious Behavior: No Danger to Others: No Duty to Warn:no Physical Aggression / Violence:No  Access to Firearms a concern: No  Gang Involvement:No   Subjective: Counselor assessed pt current functioning per pt report.  Processed w/pt mood, stressors and positives.  Reflected positive coping w/stressors and encouraged assertive communication.  Discussed decisions and planning w/ future and independence.    Pt affect wnl.  Pt reported feeling good about her work, growth, progress.  Pt reported that she has had some stressors w/ sister who seems easily irritable and snappy.  Pt reported on assertive responses. Pt looking forward to vacation in a week.  Pt reported on meeting goals financially and exploring options for moving out and considering living w/ boyfriend and another roommate in near future.    Interventions: Cognitive Behavioral Therapy and Assertiveness/Communication  Diagnosis:Adjustment disorder with mixed anxiety and depressed mood  Plan: Pt to f/u in one  month for counseling to assist coping w/ stressors.  Pt to f/u as scheduled w/ PCP.    Treatment Plan 07/13/21 Client Abilities/Strengths  supports- boyfriend, couple close friends enjoys drawing, reading, crocheting. 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 continue med management w/ PCP  Client Statement of Needs  pt "still have counseling for stress and things that come up- continue to adjust to changes in past year and keep anxiety and sadness down"  Treatment Level  outpatient counseling  Symptoms  Excessive and/or unrealistic worry that is difficult to control: No Description Entered (Status: maintained). Feelings of depression and anxiety related to complaints of job dissatisfaction or the stress of employment responsibilities.: No Description Entered (Status: maintained). Lack of energy.: No Description Entered (Status: maintained). Poor concentration and indecisiveness.: No Description Entered (Status: maintained).  Problems Addressed  Unipolar Depression, Anxiety, Vocational Stress  Goals 1. Develop healthy thinking patterns and beliefs about self, others, and the world that lead to the alleviation and help prevent the relapse of depression. Objective Identify and replace thoughts and beliefs that support depression. Target Date: 2022-07-13 Frequency: Daily  Progress: 70 Modality: individual  Related Interventions Conduct Cognitive-Behavioral Therapy (see Cognitive Behavior Therapy by Reola Calkins; Overcoming Depression by Agapito Games al.), beginning with helping the client learn the connection among cognition, depressive feelings, and actions. 2. Enhance ability to effectively cope with the full variety of life's worries and anxieties. Objective Learn and implement calming skills to reduce overall anxiety and manage anxiety symptoms. Target Date: 2022-07-13 Frequency: Daily  Progress: 70 Modality: individual  Related  Interventions  Teach the client calming/relaxation skills (e.g., applied relaxation, progressive muscle relaxation, cue controlled relaxation; mindful breathing; biofeedback) and how to discriminate better between relaxation and tension; teach the client how to apply these skills to his/her daily life (e.g., New Directions in Progressive Muscle Relaxation by Marcelyn Ditty, and Hazlett-Stevens; Treating Generalized Anxiety Disorder by Rygh and Ida Rogue). Objective Identify, challenge, and replace biased, fearful self-talk with positive, realistic, and empowering self-talk. Target Date: 2022-07-13 Frequency: Daily  Progress: 70 Modality: individual  Related Interventions Explore the client's schema and self-talk that mediate his/her fear response; assist him/her in challenging the biases; replace the distorted messages with reality-based alternatives and positive, realistic self-talk that will increase his/her self-confidence in coping with irrational fears (see Cognitive Therapy of Anxiety Disorders by Laurence Slate). 3. Pursue employment consistency with a values for work life balance Objective Identify and replace distorted cognitive messages associated with feelings of job stress.  Target Date: 2022-07-13 Frequency: Daily  Progress: 0 Modality: individual  Related Interventions Probe and clarify the client's emotions surrounding his/her vocational stress. Pt participated in development of plan and provided verbal consent.      Forde Radon, [facility]

## 2022-05-13 ENCOUNTER — Ambulatory Visit (INDEPENDENT_AMBULATORY_CARE_PROVIDER_SITE_OTHER): Payer: No Typology Code available for payment source | Admitting: Psychology

## 2022-05-13 DIAGNOSIS — F4323 Adjustment disorder with mixed anxiety and depressed mood: Secondary | ICD-10-CM | POA: Diagnosis not present

## 2022-05-13 NOTE — Progress Notes (Signed)
[facility] Counselor/Therapist Progress Note  Patient ID: Danielle Perry, MRN: [id number],    Date: 05/13/2022  Time Spent: 12:07pm-1:01pm     Treatment Type: Individual Therapy  Pt is seen for a virtual video visit via caregility.  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   Reported Symptoms: mood good- continued decreased anxiety, increased self confidence w/ work, some conflict w/ sister. Mental Status Exam: Appearance:  Well Groomed     Behavior: Appropriate  Motor: Normal  Speech/Language:  Normal Rate  Affect: Appropriate  Mood: normal  Thought process: normal  Thought content:   WNL  Sensory/Perceptual disturbances:   WNL  Orientation: oriented to person, place, time/date, and situation  Attention: Good  Concentration: Good  Memory: WNL  Fund of knowledge:  Good  Insight:   Good  Judgment:  Good  Impulse Control: Good   Risk Assessment: Danger to Self:  No Self-injurious Behavior: No Danger to Others: No Duty to Warn:no Physical Aggression / Violence:No  Access to Firearms a concern: No  Gang Involvement:No   Subjective: Counselor assessed pt current functioning per pt report.  Processed w/pt mood and interactions w/ others.  Reflected improvements w/ coping and increasing assertive communication.  Discussed other ways of asserting and setting boundaries.  Pt affect wnl.  Pt reported feeling that she is doing well overall.  Pt has been stepping off her medication gradually and in past week no longer taking her Zoloft.  Pt feels good about coping through stressors w/ other coping skills and recognizing focus on her mindful/grounding strategies.  Pt reported some difficult interactions w/ mom yelling and w/ grandmother comments about acne and weight.  Pt discussed ways she is asserting and other steps she can take.  Pt discussed some positives as well w/ interactions, family vacation, time w/ boyfriend, increased physical activity.     Interventions: Cognitive Behavioral Therapy and Assertiveness/Communication  Diagnosis:Adjustment disorder with mixed anxiety and depressed mood  Plan: Pt to f/u in 2 months for counseling to assist coping w/ stressors.  Pt to f/u as scheduled w/ PCP.    Treatment Plan 07/13/21 Client Abilities/Strengths  supports- boyfriend, couple close friends enjoys drawing, reading, crocheting. 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 continue med management w/ PCP  Client Statement of Needs  pt "still have counseling for stress and things that come up- continue to adjust to changes in past year and keep anxiety and sadness down"  Treatment Level  outpatient counseling  Symptoms  Excessive and/or unrealistic worry that is difficult to control: No Description Entered (Status: maintained). Feelings of depression and anxiety related to complaints of job dissatisfaction or the stress of employment responsibilities.: No Description Entered (Status: maintained). Lack of energy.: No Description Entered (Status: maintained). Poor concentration and indecisiveness.: No Description Entered (Status: maintained).  Problems Addressed  Unipolar Depression, Anxiety, Vocational Stress  Goals 1. Develop healthy thinking patterns and beliefs about self, others, and the world that lead to the alleviation and help prevent the relapse of depression. Objective Identify and replace thoughts and beliefs that support depression. Target Date: 2022-07-13 Frequency: Daily  Progress: 70 Modality: individual  Related Interventions Conduct Cognitive-Behavioral Therapy (see Cognitive Behavior Therapy by Reola Calkins; Overcoming Depression by Agapito Games al.), beginning with helping the client learn the connection among cognition, depressive feelings, and actions. 2. Enhance ability to effectively cope with the full variety of life's worries and  anxieties. Objective Learn and implement calming skills to reduce overall anxiety and manage anxiety symptoms. Target Date: 2022-07-13 Frequency: Daily  Progress: 70 Modality: individual  Related Interventions Teach the client calming/relaxation skills (e.g., applied relaxation, progressive muscle relaxation, cue controlled relaxation; mindful breathing; biofeedback) and how to discriminate better between relaxation and tension; teach the client how to apply these skills to his/her daily life (e.g., New Directions in Progressive Muscle Relaxation by Marcelyn Ditty, and Hazlett-Stevens; Treating Generalized Anxiety Disorder by Rygh and Ida Rogue). Objective Identify, challenge, and replace biased, fearful self-talk with positive, realistic, and empowering self-talk. Target Date: 2022-07-13 Frequency: Daily  Progress: 70 Modality: individual  Related Interventions Explore the client's schema and self-talk that mediate his/her fear response; assist him/her in challenging the biases; replace the distorted messages with reality-based alternatives and positive, realistic self-talk that will increase his/her self-confidence in coping with irrational fears (see Cognitive Therapy of Anxiety Disorders by Laurence Slate). 3. Pursue employment consistency with a values for work life balance Objective Identify and replace distorted cognitive messages associated with feelings of job stress.  Target Date: 2022-07-13 Frequency: Daily  Progress: 0 Modality: individual  Related Interventions Probe and clarify the client's emotions surrounding his/her vocational stress. Pt participated in development of plan and provided verbal consent.      Forde Radon, [facility]

## 2022-07-12 ENCOUNTER — Ambulatory Visit (INDEPENDENT_AMBULATORY_CARE_PROVIDER_SITE_OTHER): Payer: No Typology Code available for payment source | Admitting: Psychology

## 2022-07-12 DIAGNOSIS — F4323 Adjustment disorder with mixed anxiety and depressed mood: Secondary | ICD-10-CM

## 2022-07-12 NOTE — Progress Notes (Signed)
[facility] Counselor/Therapist Progress Note  Patient ID: Danielle Perry, MRN: [id number],    Date: 07/12/2022  Time Spent: 12:00pm-12:55pm     Treatment Type: Individual Therapy  Pt is seen for a virtual video visit via caregility.  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   Reported Symptoms: continued confidence w/ work, increased setting boundaries at home, some increased emotional sensitivity but feels that managing.  Mental Status Exam: Appearance:  Well Groomed     Behavior: Appropriate  Motor: Normal  Speech/Language:  Normal Rate  Affect: Appropriate  Mood: sad  at times  Thought process: normal  Thought content:   WNL  Sensory/Perceptual disturbances:   WNL  Orientation: oriented to person, place, time/date, and situation  Attention: Good  Concentration: Good  Memory: WNL  Fund of knowledge:  Good  Insight:   Good  Judgment:  Good  Impulse Control: Good   Risk Assessment: Danger to Self:  No Self-injurious Behavior: No Danger to Others: No Duty to Warn:no Physical Aggression / Violence:No  Access to Firearms a concern: No  Gang Involvement:No   Subjective: Counselor assessed pt current functioning per pt report.  Processed w/pt mood and interactions w/ others.  Reflected some avoidance and ways to set boundaries and how to communicated practiced/modeled.  Explored w/ pt positive interactions and stpes pt is taking for self and her independence.   Pt affect wnl.  Pt reported feeling that she is doing well overall. Pt reports she is no longer on her medication and feels a little more sensitive over past month- tearful w/ songs etc.  But feels that she is managing and not becoming overwhelmed in interactions or at work.  Pt reported avoidance of her grandmother and plans to visit this weekend and acknowledges ways to express thoughts and feelings to her- set boundaries re: hurtful and unwanted statements.  Pt reports that mom  still stressor at times w/ mom escalating on minor things. Pt reports she has been setting boundaries and spending time w/her boyfriend and away from the house when needed.  Pt feels good about work and how she is doing there.  Pt reported enjoyed weekend away visiting friend and relaxing.  Pt pans for other ways of relaxation for self.  Pt reports making progress w/ her saving goals and plans for future financial independence.   Interventions: Cognitive Behavioral Therapy and Assertiveness/Communication  Diagnosis:No diagnosis found.  Plan: Pt to f/u in 2 months for counseling to assist coping w/ stressors.  Pt to f/u as scheduled w/ PCP.    Treatment Plan 07/13/21 Client Abilities/Strengths  supports- boyfriend, couple close friends enjoys drawing, reading, crocheting. 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 continue med management w/ PCP  Client Statement of Needs  pt "still have counseling for stress and things that come up- continue to adjust to changes in past year and keep anxiety and sadness down"  Treatment Level  outpatient counseling  Symptoms  Excessive and/or unrealistic worry that is difficult to control: No Description Entered (Status: maintained). Feelings of depression and anxiety related to complaints of job dissatisfaction or the stress of employment responsibilities.: No Description Entered (Status: maintained). Lack of energy.: No Description Entered (Status: maintained). Poor concentration and indecisiveness.: No Description Entered (Status: maintained).  Problems Addressed  Unipolar Depression, Anxiety, Vocational Stress  Goals 1. Develop healthy thinking patterns and beliefs about self, others, and the world  that lead to the alleviation and help prevent the relapse of depression. Objective Identify and replace thoughts and beliefs that support depression. Target Date: 2022-07-13 Frequency: Daily   Progress: 70 Modality: individual  Related Interventions Conduct Cognitive-Behavioral Therapy (see Cognitive Behavior Therapy by Olevia Bowens; Overcoming Depression by Lynita Lombard al.), beginning with helping the client learn the connection among cognition, depressive feelings, and actions. 2. Enhance ability to effectively cope with the full variety of life's worries and anxieties. Objective Learn and implement calming skills to reduce overall anxiety and manage anxiety symptoms. Target Date: 2022-07-13 Frequency: Daily  Progress: 70 Modality: individual  Related Interventions Teach the client calming/relaxation skills (e.g., applied relaxation, progressive muscle relaxation, cue controlled relaxation; mindful breathing; biofeedback) and how to discriminate better between relaxation and tension; teach the client how to apply these skills to his/her daily life (e.g., New Directions in Progressive Muscle Relaxation by Casper Harrison, and Hazlett-Stevens; Treating Generalized Anxiety Disorder by Rygh and Amparo Bristol). Objective Identify, challenge, and replace biased, fearful self-talk with positive, realistic, and empowering self-talk. Target Date: 2022-07-13 Frequency: Daily  Progress: 70 Modality: individual  Related Interventions Explore the client's schema and self-talk that mediate his/her fear response; assist him/her in challenging the biases; replace the distorted messages with reality-based alternatives and positive, realistic self-talk that will increase his/her self-confidence in coping with irrational fears (see Cognitive Therapy of Anxiety Disorders by Alison Stalling). 3. Pursue employment consistency with a values for work life balance Objective Identify and replace distorted cognitive messages associated with feelings of job stress.  Target Date: 2022-07-13 Frequency: Daily  Progress: 0 Modality: individual  Related Interventions Probe and clarify the client's emotions surrounding  his/her vocational stress. Pt participated in development of plan and provided verbal consent.        Jan Fireman, [facility]

## 2022-09-13 ENCOUNTER — Ambulatory Visit (INDEPENDENT_AMBULATORY_CARE_PROVIDER_SITE_OTHER): Payer: 59 | Admitting: Psychology

## 2022-09-13 DIAGNOSIS — F4323 Adjustment disorder with mixed anxiety and depressed mood: Secondary | ICD-10-CM | POA: Diagnosis not present

## 2022-09-13 NOTE — Progress Notes (Signed)
[facility] Counselor/Therapist Progress Note  Patient ID: Danielle Perry, MRN: [id number],    Date: 09/13/2022  Time Spent: 12:00pm-12:52pm     Treatment Type: Individual Therapy  Pt is seen for a virtual video visit via caregility.  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   Reported Symptoms: continued confidence w/ work, increased setting boundaries at home, mood feeling good.  Mental Status Exam: Appearance:  Well Groomed     Behavior: Appropriate  Motor: Normal  Speech/Language:  Normal Rate  Affect: Appropriate  Mood: normal   Thought process: normal  Thought content:   WNL  Sensory/Perceptual disturbances:   WNL  Orientation: oriented to person, place, time/date, and situation  Attention: Good  Concentration: Good  Memory: WNL  Fund of knowledge:  Good  Insight:   Good  Judgment:  Good  Impulse Control: Good   Risk Assessment: Danger to Self:  No Self-injurious Behavior: No Danger to Others: No Duty to Warn:no Physical Aggression / Violence:No  Access to Firearms a concern: No  Gang Involvement:No   Subjective: Counselor assessed pt current functioning per pt report.  Processed w/pt improved mood and coping w/ stressors.  Reflected positives of awareness gained and setting boundaries for self.  Explored w/ pt positive interactions and stpes pt is taking for self and her independence.   Discussed tx goals, plan and discharge from counseling services. Pt affect wnl.  Pt reported feeling that her mood is good.  Pt reported on positives and stressors in past month.  Pt reported that she is doing well w/ her mood and managing stressors.  Pt reports continued confidence w/ work and how to approach stressors.  Pt reported use of her skills for self care and positive distractions and setting boundaries for self.   Pt reports that she has been enjoying reading, crocheting and using weekends to decompress.  Pt feels that she has met her  goals for counseling and no further follow up needed at this time.    Interventions: Cognitive Behavioral Therapy and Assertiveness/Communication  Diagnosis:Adjustment disorder with mixed anxiety and depressed mood- resolved  Plan: Pt discharging from counseling as has met goals.  Pt to f/u as needed w/ PCP.  Pt to return if needed for counseling in the future.   Treatment Plan 07/13/21 reviewed and revised 09/13/22 Client Abilities/Strengths  supports- boyfriend, couple close friends- enjoys drawing, reading, crocheting. 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 Client Statement of Needs  pt "I have meet my goals.  I have been able to manage w/ stress at home and at work.  I would like to wrap up counseling today and will reach out if need in the future."  Treatment Level  outpatient counseling   Symptoms  Hx of Excessive and/or unrealistic worry that is difficult to control. Hx of Feelings of depression and anxiety related to complaints of job dissatisfaction or the stress of employment responsibilities. Hx of Lack of energy and Poor concentration and indecisiveness.:  Problems Addressed  Unipolar Depression, Anxiety, Vocational Stress  Goals 1. Develop healthy thinking patterns and beliefs about self, others, and the world that lead to the alleviation and help prevent the relapse of depression. Objective Identify and replace thoughts and beliefs that support depression. Target Date: 2022-11-12 Frequency: Daily  Progress: 90 Modality: individual  Related Interventions Conduct Cognitive-Behavioral Therapy (see Cognitive Behavior Therapy  by Olevia Bowens; Overcoming Depression by Lynita Lombard al.), beginning with helping the client learn the connection among cognition, depressive feelings, and actions. 2. Enhance ability to effectively cope with the full variety of life's worries and  anxieties. Objective Learn and implement calming skills to reduce overall anxiety and manage anxiety symptoms. Target Date: 2022-11-12 Frequency: Daily  Progress: 90 Modality: individual  Related Interventions Teach the client calming/relaxation skills (e.g., applied relaxation, progressive muscle relaxation, cue controlled relaxation; mindful breathing; biofeedback) and how to discriminate better between relaxation and tension; teach the client how to apply these skills to his/her daily life (e.g., New Directions in Progressive Muscle Relaxation by Casper Harrison, and Hazlett-Stevens; Treating Generalized Anxiety Disorder by Rygh and Amparo Bristol). Objective Identify, challenge, and replace biased, fearful self-talk with positive, realistic, and empowering self-talk. Target Date: 2022-11-12 Frequency: Daily  Progress: 90 Modality: individual  Related Interventions Explore the client's schema and self-talk that mediate his/her fear response; assist him/her in challenging the biases; replace the distorted messages with reality-based alternatives and positive, realistic self-talk that will increase his/her self-confidence in coping with irrational fears (see Cognitive Therapy of Anxiety Disorders by Alison Stalling). 3. Pursue employment consistency with a values for work life balance Objective Identify and replace distorted cognitive messages associated with feelings of job stress.  Target Date: 2022-11-12 Frequency: Daily  Progress: 90 Modality: individual  Related Interventions Probe and clarify the client's emotions surrounding his/her vocational stress. Pt participated in development of plan and provided verbal consent.         Jan Fireman, [facility]

## 2022-10-05 ENCOUNTER — Ambulatory Visit: Payer: Self-pay | Admitting: Family Medicine

## 2022-12-28 ENCOUNTER — Encounter: Payer: Self-pay | Admitting: Family Medicine

## 2022-12-31 ENCOUNTER — Encounter: Payer: Self-pay | Admitting: Family Medicine

## 2023-01-10 ENCOUNTER — Ambulatory Visit: Payer: 59 | Admitting: Family Medicine

## 2023-01-10 ENCOUNTER — Encounter: Payer: Self-pay | Admitting: Family Medicine

## 2023-01-10 ENCOUNTER — Other Ambulatory Visit (HOSPITAL_COMMUNITY)
Admission: RE | Admit: 2023-01-10 | Discharge: 2023-01-10 | Disposition: A | Discharge status: Home / Self Care | Payer: 59 | Source: Ambulatory Visit | Attending: Family Medicine | Admitting: Family Medicine

## 2023-01-10 VITALS — BP 120/76 | HR 85 | Temp 98.1°F | Ht 61.75 in | Wt 161.0 lb

## 2023-01-10 DIAGNOSIS — F4323 Adjustment disorder with mixed anxiety and depressed mood: Secondary | ICD-10-CM | POA: Diagnosis not present

## 2023-01-10 DIAGNOSIS — Z01419 Encounter for gynecological examination (general) (routine) without abnormal findings: Secondary | ICD-10-CM | POA: Diagnosis present

## 2023-01-10 DIAGNOSIS — N946 Dysmenorrhea, unspecified: Secondary | ICD-10-CM

## 2023-01-10 DIAGNOSIS — Z0000 Encounter for general adult medical examination without abnormal findings: Secondary | ICD-10-CM

## 2023-01-10 DIAGNOSIS — L700 Acne vulgaris: Secondary | ICD-10-CM | POA: Diagnosis not present

## 2023-01-10 MED ORDER — NORGESTIMATE-ETH ESTRADIOL 0.25-35 MG-MCG PO TABS: 1.0000 | ORAL_TABLET | Freq: Every day | ORAL | 3 refills | Status: DC

## 2023-01-10 NOTE — Assessment & Plan Note (Signed)
Helped with OC /monophasic  Improved  Continue ortho cyclen

## 2023-01-10 NOTE — Assessment & Plan Note (Signed)
Doing well -improved  Counseling has helped with coping techniques/strategies for stress No longer taking zoloft

## 2023-01-10 NOTE — Assessment & Plan Note (Signed)
Reviewed health habits including diet and exercise and skin cancer prevention Reviewed appropriate screening tests for age  Also reviewed health mt list, fam hx and immunization status , as well as social and family history   See HPI Labs reviewed and ordered  Pap and gyn exam today/continues OC Declines STD screen/ low risk  Encouraged self breast exams Mood is better / PHQ of 4 , in counseling/no longer on medication  Rev labs from 2022/ declines lab today

## 2023-01-10 NOTE — Progress Notes (Signed)
Subjective:    Patient ID: Danielle Perry, female    DOB: 01-12-1996, 27 y.o.   MRN: [id number]  HPI Here for health maintenance exam and to review chronic medical problems    Wt Readings from Last 3 Encounters:  01/10/23 161 lb (73 kg)  12/25/21 164 lb 3.2 oz (74.5 kg)  09/14/21 161 lb 8 oz (73.3 kg)   29.69 kg/m  Vitals:   01/10/23 0845  BP: 120/76  Pulse: 85  Temp: 98.1 F (36.7 C)  SpO2: 100%    Working at same place for 2 years Taking care of herself   Lost wt to under 160 - was thrilled  Walking - a little running  Working out -just added some [employer] healthier also during the week  Not as good on the weekends   Trying to control bread     Immunization History  Administered Date(s) Administered   DTaP 11/07/1996, 01/08/1997, 03/12/1997, 04/07/1998, 11/14/2000   HIB (PRP-OMP) 11/07/1996, 01/08/1997, 03/12/1997, 09/20/1997   HPV 9-valent 11/15/2018, 01/17/2019, 05/22/2019   Hepatitis B [birth year], 10/10/1996, 06/12/1997   IPV 11/07/1996, 01/08/1997, 03/12/1997, 11/14/2000   MMR 09/09/1997, 11/14/2000   Meningococcal Conjugate 10/19/2013, 02/15/2017   PFIZER(Purple Top)SARS-COV-2 Vaccination 02/02/2020, 02/23/2020   Tdap 01/09/2009, 12/11/2019   Varicella 09/09/1997   Health Maintenance Due  Topic Date Due   PAP-Cervical Cytology Screening  11/14/2021   PAP SMEAR-Modifier  11/14/2021    Pap 11/2018 neg/ neg std screen On [facility] ortho cyclen Will do pap today  Periods are ok - regular / no major cramps  Every few months has a heavy day  Likes pill she is on   Declines STD screening    Self breast exam: no lumps   Mood Sees counselor for adj reaction  Took zoloft in the past      01/10/2023    8:48 AM 12/25/2021    9:05 AM 06/20/2020   11:28 AM 12/11/2019    9:38 AM 11/15/2018    4:40 PM  Depression screen PHQ 2/9  Decreased Interest 1 2 2 1 1   Down, Depressed, Hopeless 1  2 2 1   PHQ - 2 Score 2 2 4 3 2   Altered sleeping 1 1 3 3 3    Tired, decreased energy 1 2 3 2 2   Change in appetite 0 1 2 2 2   Feeling bad or failure about yourself  0 1 3 3 2   Trouble concentrating 0 1 3 2 2   Moving slowly or fidgety/restless 0 1 2 2 1   Suicidal thoughts 0 0 0 0 0  PHQ-9 Score 4 9 20 17 14   Difficult doing work/chores Not difficult at all  Very difficult        Normal labs in 2022  Lab Results  Component Value Date   CHOL 150 12/16/2020   CHOL 154 12/11/2019   Lab Results  Component Value Date   HDL 57.10 12/16/2020   HDL 63.30 12/11/2019   Lab Results  Component Value Date   LDLCALC 62 12/16/2020   LDLCALC 71 12/11/2019   Lab Results  Component Value Date   TRIG 153.0 (H) 12/16/2020   TRIG 97.0 12/11/2019   Lab Results  Component Value Date   CHOLHDL 3 12/16/2020   CHOLHDL 2 12/11/2019   No results found for: "LDLDIRECT"  Wants to skip labs this year   Patient Active Problem List   Diagnosis Date Noted   Adjustment disorder 06/20/2020   Encounter for gynecological  examination (general) (routine) without abnormal findings 11/15/2018   Routine general medical examination at a health care facility 02/15/2017   Dysmenorrhea 10/19/2013   Acne 10/19/2013   History reviewed. No pertinent past medical history. History reviewed. No pertinent surgical history. Social History   Tobacco Use   Smoking status: Never   Smokeless tobacco: Never  Substance Use Topics   Alcohol use: Yes    Comment: occ   Drug use: No   Family History  Problem Relation Age of Onset   Diabetes Mellitus I Paternal Grandfather    No Active Allergies No current outpatient medications on file prior to visit.   No current facility-administered medications on file prior to visit.    Review of Systems  Constitutional:  Negative for activity change, appetite change, fatigue, fever and unexpected weight change.  HENT:  Negative for congestion, ear pain, rhinorrhea, sinus pressure and sore throat.   Eyes:  Negative for pain,  redness and visual disturbance.  Respiratory:  Negative for cough, shortness of breath and wheezing.   Cardiovascular:  Negative for chest pain and palpitations.  Gastrointestinal:  Negative for abdominal pain, blood in stool, constipation and diarrhea.  Endocrine: Negative for polydipsia and polyuria.  Genitourinary:  Negative for dysuria, frequency and urgency.  Musculoskeletal:  Negative for arthralgias, back pain and myalgias.  Skin:  Negative for pallor and rash.  Allergic/Immunologic: Negative for environmental allergies.  Neurological:  Negative for dizziness, syncope and headaches.  Hematological:  Negative for adenopathy. Does not bruise/bleed easily.  Psychiatric/Behavioral:  Negative for decreased concentration and dysphoric mood. The patient is not nervous/anxious.        Some ups and downs with stress       Objective:   Physical Exam Constitutional:      General: She is not in acute distress.    Appearance: Normal appearance. She is well-developed. She is not ill-appearing or diaphoretic.  HENT:     Head: Normocephalic and atraumatic.     Right Ear: Tympanic membrane, ear canal and external ear normal.     Left Ear: Tympanic membrane, ear canal and external ear normal.     Nose: Nose normal. No congestion.     Mouth/Throat:     Mouth: Mucous membranes are moist.     Pharynx: Oropharynx is clear. No posterior oropharyngeal erythema.  Eyes:     General: No scleral icterus.    Extraocular Movements: Extraocular movements intact.     Conjunctiva/sclera: Conjunctivae normal.     Pupils: Pupils are equal, round, and reactive to light.  Neck:     Thyroid: No thyromegaly.     Vascular: No carotid bruit or JVD.  Cardiovascular:     Rate and Rhythm: Normal rate and regular rhythm.     Pulses: Normal pulses.     Heart sounds: Normal heart sounds.     No gallop.  Pulmonary:     Effort: Pulmonary effort is normal. No respiratory distress.     Breath sounds: Normal breath  sounds. No wheezing.     Comments: Good air exch Chest:     Chest wall: No tenderness.  Abdominal:     General: Bowel sounds are normal. There is no distension or abdominal bruit.     Palpations: Abdomen is soft. There is no mass.     Tenderness: There is no abdominal tenderness.     Hernia: No hernia is present.  Genitourinary:    Comments: Breast exam: No mass, nodules, thickening, tenderness, bulging, retraction,  inflamation, nipple discharge or skin changes noted.  No axillary or clavicular LA.                   Anus appears normal w/o hemorrhoids or masses       External genitalia : nl appearance and hair distribution/no lesions       Urethral meatus : nl size, no lesions or prolapse       Urethra: no masses, tenderness or scarring      Bladder : no masses or tenderness       Vagina: nl general appearance, no discharge or  Lesions, no significant cystocele  or rectocele       Cervix: no lesions/ discharge or friability      Uterus: nl size, contour, position, and mobility (not fixed) , non tender      Adnexa : no masses, tenderness, enlargement or nodularity          Musculoskeletal:        General: No tenderness. Normal range of motion.     Cervical back: Normal range of motion and neck supple. No rigidity. No muscular tenderness.     Right lower leg: No edema.     Left lower leg: No edema.     Comments: No kyphosis   Lymphadenopathy:     Cervical: No cervical adenopathy.  Skin:    General: Skin is warm and dry.     Coloration: Skin is not pale.     Findings: No erythema or rash.     Comments: Fair Some mild comedonal acne/no cysts on face and back   Few lentigines  Neurological:     Mental Status: She is alert. Mental status is at baseline.     Cranial Nerves: No cranial nerve deficit.     Motor: No abnormal muscle tone.     Coordination: Coordination normal.     Gait: Gait normal.     Deep Tendon Reflexes: Reflexes are normal and symmetric.  Psychiatric:         Mood and Affect: Mood normal.        Cognition and Memory: Cognition and memory normal.           Assessment & Plan:   Problem List Items Addressed This Visit       Musculoskeletal and Integument   Acne    Improved with ortho cyclen       Relevant Medications   norgestimate-ethinyl estradiol (ORTHO-CYCLEN) 0.25-35 MG-MCG tablet     Genitourinary   Dysmenorrhea    Helped with OC /monophasic  Improved  Continue ortho cyclen         Other   Adjustment disorder    Doing well -improved  Counseling has helped with coping techniques/strategies for stress No longer taking zoloft       Encounter for gynecological examination (general) (routine) without abnormal findings    Routine exam Pap with reflex HPV today   No complaints  On OC-will continue this  Declines STD screen      Relevant Orders   Cytology - PAP([facility]or[facility])   Routine general medical examination at a health care facility - Primary    Reviewed health habits including diet and exercise and skin cancer prevention Reviewed appropriate screening tests for age  Also reviewed health mt list, fam hx and immunization status , as well as social and family history   See HPI Labs reviewed and ordered  Pap and gyn exam today/continues [facility] Declines STD screen/  low risk  Encouraged self breast exams Mood is better / PHQ of 4 , in counseling/no longer on medication  Rev labs from 2022/ declines lab today

## 2023-01-10 NOTE — Assessment & Plan Note (Signed)
Improved with ortho cyclen

## 2023-01-10 NOTE — Assessment & Plan Note (Signed)
Routine exam Pap with reflex HPV today   No complaints  On OC-will continue this  Declines STD screen

## 2023-01-10 NOTE — Patient Instructions (Addendum)
Add some strength training to your routine, this is important for bone and brain health and can reduce your risk of falls and help your body use insulin properly and regulate weight  Light weights, exercise bands , and internet videos are a good way to start  Yoga (chair or regular), machines , floor exercises or a gym with machines are also good options    Try to get most of your carbohydrates from produce (with the exception of white potatoes)  Eat less bread/pasta/rice/snack foods/cereals/sweets and other items from the middle of the grocery store (processed carbs)    Take care of yourself  Continue the same oral contraceptive

## 2023-01-13 LAB — CYTOLOGY - PAP
Comment: NEGATIVE
Diagnosis: NEGATIVE
High risk HPV: NEGATIVE

## 2023-01-17 ENCOUNTER — Encounter: Payer: Self-pay | Admitting: *Deleted

## 2023-03-09 ENCOUNTER — Encounter: Payer: Self-pay | Admitting: Family Medicine

## 2023-05-26 ENCOUNTER — Encounter: Payer: Self-pay | Admitting: Family Medicine

## 2023-05-26 ENCOUNTER — Ambulatory Visit: Payer: 59 | Admitting: Family Medicine

## 2023-05-26 VITALS — BP 132/78 | HR 90 | Temp 97.6°F | Ht 61.75 in | Wt 153.2 lb

## 2023-05-26 DIAGNOSIS — H9201 Otalgia, right ear: Secondary | ICD-10-CM | POA: Insufficient documentation

## 2023-05-26 MED ORDER — PREDNISONE 10 MG PO TABS: ORAL_TABLET | ORAL | 0 refills | Status: DC

## 2023-05-26 NOTE — Assessment & Plan Note (Signed)
Suspect from TM  Also dull/ full feeling with popping  In setting of allergies and also weather change Reassuring exam  Prescription prednisone - discussed side effects Update if not starting to improve in a week or if worsening  Call back and Er precautions noted in detail today    Meds ordered this encounter  Medications   predniSONE (DELTASONE) 10 MG tablet    Sig: Take 3 pills once daily by mouth for 3 days, then 2 pills once daily for 3 days, then 1 pill once daily for 3 days and then stop    Dispense:  18 tablet    Refill:  0

## 2023-05-26 NOTE — Patient Instructions (Addendum)
Try prednisone as directed   (it may make you feel hyper and hungry)  Tylenol for pain as needed   Watch for congestion / fever/ green mucous out of nose   Don't put anything in the ear your now   Update if not starting to improve in a week or if worsening    If any severe symptoms, go to the ER
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
## 2023-07-07 ENCOUNTER — Other Ambulatory Visit: Payer: Self-pay

## 2023-07-08 ENCOUNTER — Ambulatory Visit: Payer: 59 | Admitting: Family Medicine

## 2023-07-08 ENCOUNTER — Encounter: Payer: Self-pay | Admitting: Family Medicine

## 2023-07-08 VITALS — BP 110/78 | HR 78 | Temp 98.6°F | Ht 61.0 in | Wt 159.0 lb

## 2023-07-08 DIAGNOSIS — H9201 Otalgia, right ear: Secondary | ICD-10-CM

## 2023-07-08 DIAGNOSIS — J0100 Acute maxillary sinusitis, unspecified: Secondary | ICD-10-CM

## 2023-07-08 DIAGNOSIS — J329 Chronic sinusitis, unspecified: Secondary | ICD-10-CM | POA: Insufficient documentation

## 2023-07-08 MED ORDER — AMOXICILLIN-POT CLAVULANATE 875-125 MG PO TABS: 1.0000 | ORAL_TABLET | Freq: Two times a day (BID) | ORAL | 0 refills | Status: DC

## 2023-07-08 NOTE — Progress Notes (Signed)
Subjective:    Patient ID: Danielle Perry, female    DOB: 1996/05/21, 27 y.o.   MRN: [id number]  HPI  Wt Readings from Last 3 Encounters:  07/08/23 159 lb (72.1 kg)  05/26/23 153 lb 4 oz (69.5 kg)  01/10/23 161 lb (73 kg)   30.04 kg/m  Vitals:   07/08/23 0753  BP: 110/78  Pulse: 78  Temp: 98.6 F (37 C)  SpO2: 98%    Pt presents with ear discomfort   Last visit treated ETD in right ear with prednisone at end of sept  Was good for 3 months   Exposed to dirt and dust  Used nasal saline   A lot of pressure in ear 10/26   Now some facial pain on right /in cheek under eye    Uses steroid ns on and off - not now  Has helped when symptoms were milder   No hearing loss No runny nose  Some night time nasal congestion  No ST (feels dry)  No cough   No fever    Patient Active Problem List   Diagnosis Date Noted   Sinusitis 07/08/2023   Right ear pain 05/26/2023   Adjustment disorder 06/20/2020   Encounter for gynecological examination (general) (routine) without abnormal findings 11/15/2018   Routine general medical examination at a health care facility 02/15/2017   Dysmenorrhea 10/19/2013   Acne 10/19/2013   History reviewed. No pertinent past medical history. History reviewed. No pertinent surgical history. Social History   Tobacco Use   Smoking status: Never   Smokeless tobacco: Never  Substance Use Topics   Alcohol use: Yes    Comment: occ   Drug use: No   Family History  Problem Relation Age of Onset   Diabetes Mellitus I Paternal Grandfather    No Known Allergies Current Outpatient Medications on File Prior to Visit  Medication Sig Dispense Refill   fluticasone (FLONASE) 50 MCG/ACT nasal spray Place 2 sprays into both nostrils daily.     norgestimate-ethinyl estradiol (ORTHO-CYCLEN) 0.25-35 MG-MCG tablet Take 1 tablet by mouth daily. 84 tablet 3   No current facility-administered medications on file prior to visit.    Review of Systems   Constitutional:  Negative for activity change, appetite change, fatigue, fever and unexpected weight change.  HENT:  Positive for congestion, ear pain, postnasal drip, rhinorrhea, sinus pressure and sinus pain. Negative for ear discharge, facial swelling, sore throat, trouble swallowing and voice change.   Eyes:  Negative for pain, redness and visual disturbance.  Respiratory:  Negative for cough, shortness of breath and wheezing.   Cardiovascular:  Negative for chest pain and palpitations.  Gastrointestinal:  Negative for abdominal pain, blood in stool, constipation and diarrhea.  Endocrine: Negative for polydipsia and polyuria.  Genitourinary:  Negative for dysuria, frequency and urgency.  Musculoskeletal:  Negative for arthralgias, back pain and myalgias.  Skin:  Negative for pallor and rash.  Allergic/Immunologic: Negative for environmental allergies.  Neurological:  Negative for dizziness, syncope and headaches.  Hematological:  Negative for adenopathy. Does not bruise/bleed easily.  Psychiatric/Behavioral:  Negative for decreased concentration and dysphoric mood. The patient is not nervous/anxious.        Objective:   Physical Exam Constitutional:      General: She is not in acute distress.    Appearance: Normal appearance. She is well-developed. She is obese. She is not ill-appearing, toxic-appearing or diaphoretic.  HENT:     Head: Normocephalic and atraumatic.  Comments: Nares are injected and congested  (mild)  Right maxillary sinus is tender     Right Ear: Tympanic membrane, ear canal and external ear normal.     Left Ear: Tympanic membrane, ear canal and external ear normal.     Ears:     Comments: Right TM is more dull and full appearing than left TM No erythema or bulging  Canal is clear      Nose: Congestion and rhinorrhea present.     Mouth/Throat:     Mouth: Mucous membranes are moist.     Pharynx: Oropharynx is clear. No oropharyngeal exudate or posterior  oropharyngeal erythema.     Comments: Clear pnd  Eyes:     General:        Right eye: No discharge.        Left eye: No discharge.     Conjunctiva/sclera: Conjunctivae normal.     Pupils: Pupils are equal, round, and reactive to light.  Cardiovascular:     Rate and Rhythm: Normal rate.     Heart sounds: Normal heart sounds.  Pulmonary:     Effort: Pulmonary effort is normal. No respiratory distress.     Breath sounds: Normal breath sounds. No stridor. No wheezing, rhonchi or rales.  Chest:     Chest wall: No tenderness.  Musculoskeletal:     Cervical back: Normal range of motion and neck supple.  Lymphadenopathy:     Cervical: No cervical adenopathy.  Skin:    General: Skin is warm and dry.     Capillary Refill: Capillary refill takes less than 2 seconds.     Findings: No rash.  Neurological:     Mental Status: She is alert.     Cranial Nerves: No cranial nerve deficit.  Psychiatric:        Mood and Affect: Mood normal.           Assessment & Plan:   Problem List Items Addressed This Visit       Respiratory   Sinusitis    Congestion , right maxillary pain and pressure , right ear pressure  Prescription augmentin for 7 d Flonase ns Update if not starting to improve in a week or if worsening  Call back and Er precautions noted in detail today          Relevant Medications   fluticasone (FLONASE) 50 MCG/ACT nasal spray   amoxicillin-clavulanate (AUGMENTIN) 875-125 MG tablet     Other   Right ear pain - Primary    Recurrent (more pressure than pain) Resolved with prednisone and returned  May be linked to allergy congestion  Now also some left facial pain- suspect sinusitis  Treat with flonase ns daily and antibiotic  If not improved may need ENT referral   Of note-pt thinks she had myringotomy tube in that ear as a child

## 2023-07-08 NOTE — Patient Instructions (Addendum)
Get back on flonase every day in allergy season   Take augmentin for sinus infection   Update if not starting to improve in a week or if worsening    If your symptoms do not improve - please call  I will refer you to an ear doctor

## 2023-07-08 NOTE — Assessment & Plan Note (Signed)
Congestion , right maxillary pain and pressure , right ear pressure  Prescription augmentin for 7 d Flonase ns Update if not starting to improve in a week or if worsening  Call back and Er precautions noted in detail today

## 2023-07-08 NOTE — Assessment & Plan Note (Signed)
Recurrent (more pressure than pain) Resolved with prednisone and returned  May be linked to allergy congestion  Now also some left facial pain- suspect sinusitis  Treat with flonase ns daily and antibiotic  If not improved may need ENT referral   Of note-pt thinks she had myringotomy tube in that ear as a child

## 2023-12-20 ENCOUNTER — Other Ambulatory Visit: Payer: Self-pay | Admitting: Family Medicine

## 2024-01-20 ENCOUNTER — Ambulatory Visit (INDEPENDENT_AMBULATORY_CARE_PROVIDER_SITE_OTHER): Admitting: Family Medicine

## 2024-01-20 ENCOUNTER — Encounter: Payer: Self-pay | Admitting: Family Medicine

## 2024-01-20 VITALS — BP 116/70 | HR 96 | Temp 97.7°F | Ht 61.5 in | Wt 157.0 lb

## 2024-01-20 DIAGNOSIS — Z0000 Encounter for general adult medical examination without abnormal findings: Secondary | ICD-10-CM

## 2024-01-20 DIAGNOSIS — F4323 Adjustment disorder with mixed anxiety and depressed mood: Secondary | ICD-10-CM

## 2024-01-20 DIAGNOSIS — L700 Acne vulgaris: Secondary | ICD-10-CM

## 2024-01-20 DIAGNOSIS — N946 Dysmenorrhea, unspecified: Secondary | ICD-10-CM

## 2024-01-20 LAB — COMPREHENSIVE METABOLIC PANEL WITH GFR
ALT: 11 U/L (ref 0–35)
AST: 12 U/L (ref 0–37)
Albumin: 4.4 g/dL (ref 3.5–5.2)
Alkaline Phosphatase: 70 U/L (ref 39–117)
BUN: 12 mg/dL (ref 6–23)
CO2: 31 meq/L (ref 19–32)
Calcium: 9.6 mg/dL (ref 8.4–10.5)
Chloride: 103 meq/L (ref 96–112)
Creatinine, Ser: 0.7 mg/dL (ref 0.40–1.20)
GFR: 118.57 mL/min (ref >60.00)
Glucose, Bld: 75 mg/dL (ref 70–99)
Potassium: 4.2 meq/L (ref 3.5–5.1)
Sodium: 141 meq/L (ref 135–145)
Total Bilirubin: 0.4 mg/dL (ref 0.2–1.2)
Total Protein: 7 g/dL (ref 6.0–8.3)

## 2024-01-20 LAB — CBC WITH DIFFERENTIAL/PLATELET
Basophils Absolute: 0 10*3/uL (ref 0.0–0.1)
Basophils Relative: 0.3 % (ref 0.0–3.0)
Eosinophils Absolute: 0 10*3/uL (ref 0.0–0.7)
Eosinophils Relative: 0.7 % (ref 0.0–5.0)
HCT: 41.6 % (ref 36.0–46.0)
Hemoglobin: 14 g/dL (ref 12.0–15.0)
Lymphocytes Relative: 32.8 % (ref 12.0–46.0)
Lymphs Abs: 1.9 10*3/uL (ref 0.7–4.0)
MCHC: 33.7 g/dL (ref 30.0–36.0)
MCV: 86.1 fl (ref 78.0–100.0)
Monocytes Absolute: 0.4 10*3/uL (ref 0.1–1.0)
Monocytes Relative: 6.4 % (ref 3.0–12.0)
Neutro Abs: 3.6 10*3/uL (ref 1.4–7.7)
Neutrophils Relative %: 59.8 % (ref 43.0–77.0)
Platelets: 312 10*3/uL (ref 150.0–400.0)
RBC: 4.83 Mil/uL (ref 3.87–5.11)
RDW: 12.9 % (ref 11.5–15.5)
WBC: 5.9 10*3/uL (ref 4.0–10.5)

## 2024-01-20 LAB — LIPID PANEL
Cholesterol: 169 mg/dL (ref 0–200)
HDL: 61.1 mg/dL (ref >39.00)
LDL Cholesterol: 90 mg/dL (ref 0–99)
NonHDL: 108.29
Total CHOL/HDL Ratio: 3
Triglycerides: 90 mg/dL (ref 0.0–149.0)
VLDL: 18 mg/dL (ref 0.0–40.0)

## 2024-01-20 NOTE — Assessment & Plan Note (Signed)
 Doing well with current monophasic oral contraceptive

## 2024-01-20 NOTE — Patient Instructions (Addendum)
 Try to get most of your carbohydrates from produce (with the exception of white potatoes) and whole grains Eat less bread/pasta/rice/snack foods/cereals/sweets and other items from the middle of the grocery store (processed carbs) Keep up the exercise   Labs today for wellness  Continue current oral contraceptive   Wear sun protection when outside

## 2024-01-20 NOTE — Assessment & Plan Note (Signed)
 Better with OC

## 2024-01-20 NOTE — Assessment & Plan Note (Signed)
 Reviewed health habits including diet and exercise and skin cancer prevention Reviewed appropriate screening tests for age  Also reviewed health mt list, fam hx and immunization status , as well as social and family history   See HPI Labs reviewed and ordered Health Maintenance  Topic Date Due   COVID-19 Vaccine (3 - 2024-25 season) 02/04/2025*   Flu Shot  04/06/2024   Pap Smear  01/09/2026   DTaP/Tdap/Td vaccine (8 - Td or Tdap) 12/10/2029   HPV Vaccine  Completed   Hepatitis C Screening  Completed   HIV Screening  Completed   Meningitis B Vaccine  Aged Out  *Topic was postponed. The date shown is not the original due date.    Utd pap/gyn care/ continues OC Declines STD screen/no new partners  Had HPV vaccines Discussed fall prevention, supplements and exercise for bone density  Commended good exercise  PHQ 6 -that is improved / some stressors / does not want medication  Labs today

## 2024-01-20 NOTE — Assessment & Plan Note (Signed)
 Doing better overall  No medicines currently  Encouraged her to keep up the good exercise and self care

## 2024-01-20 NOTE — Progress Notes (Signed)
 Subjective:    Patient ID: Danielle Perry, female    DOB: [birth year], 28 y.o.   MRN: [id number]  HPI  Here for health maintenance exam and to review chronic medical problems   Wt Readings from Last 3 Encounters:  01/20/24 157 lb (71.2 kg)  07/08/23 159 lb (72.1 kg)  05/26/23 153 lb 4 oz (69.5 kg)   29.18 kg/m  Vitals:   01/20/24 1119  BP: 116/70  Pulse: 96  Temp: 97.7 F (36.5 C)  SpO2: 99%    Immunization History  Administered Date(s) Administered   DTaP 11/07/1996, 01/08/1997, 03/12/1997, 04/07/1998, 11/14/2000   HIB (PRP-OMP) 11/07/1996, 01/08/1997, 03/12/1997, 09/20/1997   HPV 9-valent 11/15/2018, 01/17/2019, 05/22/2019   Hepatitis B 07/15/1997, 10/10/1996, 06/12/1997   IPV 11/07/1996, 01/08/1997, 03/12/1997, 11/14/2000   MMR 09/09/1997, 11/14/2000   Meningococcal Conjugate 10/19/2013, 02/15/2017   PFIZER(Purple Top)SARS-COV-2 Vaccination 02/02/2020, 02/23/2020   Tdap 01/09/2009, 12/11/2019   Varicella 09/09/1997    There are no preventive care reminders to display for this patient.  Wants to loose weight   Self breast exam-no lumps   Gyn health Pap normal with neg HPV 01/2023  OC 0.25-35 ortho cyclen  Menses are ok /5 days /no too heavy No cramps    Std screen- declines  Same partner   Has had HPV vaccines     Bone health   Falls-none  Fractures-none  Supplements - multi vitamin / also going outside    Exercise - starting back now after knee injury /doing better now  [employer] Strength training with weights   Eating healthy       Mood    01/20/2024   11:22 AM 07/08/2023    8:00 AM 05/26/2023    8:01 AM 01/10/2023    8:48 AM 12/25/2021    9:05 AM  Depression screen PHQ 2/9  Decreased Interest 1 1 1 1 2   Down, Depressed, Hopeless 1 1 1 1    PHQ - 2 Score 2 2 2 2 2   Altered sleeping 0 1 2 1 1   Tired, decreased energy 2 2 1 1 2   Change in appetite 1 1 1  0 1  Feeling bad or failure about yourself  0 0 0 0 1  Trouble  concentrating 0 1 1 0 1  Moving slowly or fidgety/restless 1 1 0 0 1  Suicidal thoughts 0 0 0 0 0  PHQ-9 Score 6 8 7 4 9   Difficult doing work/chores Somewhat difficult Not difficult at all Somewhat difficult Not difficult at all    Took zoloft  in the past for adj disorder  Doing ok now   More irritable at times GM has alzheimers but not living with her  Another sister is moving back in  36 y old niece lives with them      Last labs 2022  Lab Results  Component Value Date   CHOL 150 12/16/2020   HDL 57.10 12/16/2020   LDLCALC 62 12/16/2020   TRIG 153.0 (H) 12/16/2020   CHOLHDL 3 12/16/2020      Patient Active Problem List   Diagnosis Date Noted   Adjustment disorder 06/20/2020   Encounter for gynecological examination (general) (routine) without abnormal findings 11/15/2018   Routine general medical examination at a health care facility 02/15/2017   Dysmenorrhea 10/19/2013   Acne 10/19/2013   Past Medical History:  Diagnosis Date   Allergy    Whenever the seasons changing   Anxiety 2021   Past Surgical History:  Procedure  Laterality Date   FRACTURE SURGERY  2010   Right wrist was popped out of socket   Social History   Tobacco Use   Smoking status: Never   Smokeless tobacco: Never   Tobacco comments:    I never smoke in my life. The question above isnt a question unless i click something wrong  Substance Use Topics   Alcohol use: Yes    Alcohol/week: 6.0 standard drinks of alcohol    Types: 3 Cans of beer, 3 Shots of liquor per week    Comment: occ   Drug use: No   Family History  Problem Relation Age of Onset   Diabetes Mellitus I Paternal Grandfather    Diabetes Paternal Grandfather    Cancer Maternal Grandfather    No Known Allergies Current Outpatient Medications on File Prior to Visit  Medication Sig Dispense Refill   MILI 0.25-35 MG-MCG tablet TAKE 1 TABLET BY MOUTH DAILY 84 tablet 3   No current facility-administered medications on file  prior to visit.    Review of Systems  Constitutional:  Negative for activity change, appetite change, fatigue, fever and unexpected weight change.  HENT:  Negative for congestion, ear pain, rhinorrhea, sinus pressure and sore throat.   Eyes:  Negative for pain, redness and visual disturbance.  Respiratory:  Negative for cough, shortness of breath and wheezing.   Cardiovascular:  Negative for chest pain and palpitations.  Gastrointestinal:  Negative for abdominal pain, blood in stool, constipation and diarrhea.  Endocrine: Negative for polydipsia and polyuria.  Genitourinary:  Negative for dysuria, frequency and urgency.  Musculoskeletal:  Negative for arthralgias, back pain and myalgias.  Skin:  Negative for pallor and rash.  Allergic/Immunologic: Negative for environmental allergies.  Neurological:  Negative for dizziness, syncope and headaches.  Hematological:  Negative for adenopathy. Does not bruise/bleed easily.  Psychiatric/Behavioral:  Negative for decreased concentration and dysphoric mood. The patient is not nervous/anxious.        Occational irritable Doing better overall        Objective:   Physical Exam Constitutional:      General: She is not in acute distress.    Appearance: Normal appearance. She is well-developed. She is not ill-appearing or diaphoretic.     Comments: Overweight   HENT:     Head: Normocephalic and atraumatic.     Right Ear: Tympanic membrane, ear canal and external ear normal.     Left Ear: Tympanic membrane, ear canal and external ear normal.     Nose: Nose normal. No congestion.     Mouth/Throat:     Mouth: Mucous membranes are moist.     Pharynx: Oropharynx is clear. No posterior oropharyngeal erythema.  Eyes:     General: No scleral icterus.    Extraocular Movements: Extraocular movements intact.     Conjunctiva/sclera: Conjunctivae normal.     Pupils: Pupils are equal, round, and reactive to light.  Neck:     Thyroid: No thyromegaly.      Vascular: No carotid bruit or JVD.  Cardiovascular:     Rate and Rhythm: Normal rate and regular rhythm.     Pulses: Normal pulses.     Heart sounds: Normal heart sounds.     No gallop.  Pulmonary:     Effort: Pulmonary effort is normal. No respiratory distress.     Breath sounds: Normal breath sounds. No wheezing.     Comments: Good air exch Chest:     Chest wall: No tenderness.  Abdominal:  General: Bowel sounds are normal. There is no distension or abdominal bruit.     Palpations: Abdomen is soft. There is no mass.     Tenderness: There is no abdominal tenderness.     Hernia: No hernia is present.  Genitourinary:    Comments: Breast exam: No mass, nodules, thickening, tenderness, bulging, retraction, inflamation, nipple discharge or skin changes noted.  No axillary or clavicular LA.     Dense breasts noted  Musculoskeletal:        General: No tenderness. Normal range of motion.     Cervical back: Normal range of motion and neck supple. No rigidity. No muscular tenderness.     Right lower leg: No edema.     Left lower leg: No edema.     Comments: No kyphosis   Lymphadenopathy:     Cervical: No cervical adenopathy.  Skin:    General: Skin is warm and dry.     Coloration: Skin is not pale.     Findings: No erythema or rash.     Comments: Solar lentigines diffusely   Neurological:     Mental Status: She is alert. Mental status is at baseline.     Cranial Nerves: No cranial nerve deficit.     Motor: No abnormal muscle tone.     Coordination: Coordination normal.     Gait: Gait normal.     Deep Tendon Reflexes: Reflexes are normal and symmetric. Reflexes normal.  Psychiatric:        Attention and Perception: Attention normal.        Mood and Affect: Mood normal.        Cognition and Memory: Cognition and memory normal.     Comments: Good mood           Assessment & Plan:   Problem List Items Addressed This Visit       Musculoskeletal and Integument   Acne    Doing well with current monophasic oral contraceptive         Genitourinary   Dysmenorrhea   Better with OC         Other   Routine general medical examination at a health care facility - Primary   Reviewed health habits including diet and exercise and skin cancer prevention Reviewed appropriate screening tests for age  Also reviewed health mt list, fam hx and immunization status , as well as social and family history   See HPI Labs reviewed and ordered Health Maintenance  Topic Date Due   COVID-19 Vaccine (3 - 2024-25 season) 02/04/2025*   Flu Shot  04/06/2024   Pap Smear  01/09/2026   DTaP/Tdap/Td vaccine (8 - Td or Tdap) 12/10/2029   HPV Vaccine  Completed   Hepatitis C Screening  Completed   HIV Screening  Completed   Meningitis B Vaccine  Aged Out  *Topic was postponed. The date shown is not the original due date.    Utd pap/gyn care/ continues OC Declines STD screen/no new partners  Had HPV vaccines Discussed fall prevention, supplements and exercise for bone density  Commended good exercise  PHQ 6 -that is improved / some stressors / does not want medication  Labs today      Relevant Orders   TSH   Lipid panel   Comprehensive metabolic panel with GFR   CBC with Differential/Platelet   Adjustment disorder   Doing better overall  No medicines currently  Encouraged her to keep up the good exercise and self care

## 2024-01-24 ENCOUNTER — Ambulatory Visit: Payer: Self-pay | Admitting: Family Medicine

## 2024-01-24 LAB — TSH: TSH: 1.12 u[IU]/mL (ref 0.35–5.50)
